# Patient Record
Sex: Female | Born: 1990 | Race: Black or African American | Hispanic: No | Marital: Single | Smoking: Current every day smoker
Health system: Southern US, Community
[De-identification: ages and names within clinical notes are randomized; demographics above are authoritative.]

## PROBLEM LIST (undated history)

## (undated) DIAGNOSIS — IMO0002 Reserved for concepts with insufficient information to code with codable children: Secondary | ICD-10-CM

## (undated) DIAGNOSIS — D573 Sickle-cell trait: Secondary | ICD-10-CM

## (undated) DIAGNOSIS — N760 Acute vaginitis: Secondary | ICD-10-CM

## (undated) DIAGNOSIS — B373 Candidiasis of vulva and vagina: Secondary | ICD-10-CM

## (undated) DIAGNOSIS — IMO0001 Reserved for inherently not codable concepts without codable children: Secondary | ICD-10-CM

## (undated) DIAGNOSIS — R896 Abnormal cytological findings in specimens from other organs, systems and tissues: Secondary | ICD-10-CM

## (undated) DIAGNOSIS — N870 Mild cervical dysplasia: Secondary | ICD-10-CM

## (undated) DIAGNOSIS — Z8742 Personal history of other diseases of the female genital tract: Secondary | ICD-10-CM

## (undated) DIAGNOSIS — L309 Dermatitis, unspecified: Secondary | ICD-10-CM

## (undated) HISTORY — DX: Abnormal cytological findings in specimens from other organs, systems and tissues: R89.6

## (undated) HISTORY — DX: Acute vaginitis: N76.0

## (undated) HISTORY — DX: Personal history of other diseases of the female genital tract: Z87.42

## (undated) HISTORY — DX: Candidiasis of vulva and vagina: B37.3

## (undated) HISTORY — DX: Reserved for concepts with insufficient information to code with codable children: IMO0002

## (undated) HISTORY — DX: Sickle-cell trait: D57.3

## (undated) HISTORY — DX: Reserved for inherently not codable concepts without codable children: IMO0001

## (undated) HISTORY — PX: WISDOM TOOTH EXTRACTION: SHX21

## (undated) HISTORY — DX: Mild cervical dysplasia: N87.0

---
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
## 2008-03-16 ENCOUNTER — Emergency Department (HOSPITAL_COMMUNITY): Admission: EM | Admit: 2008-03-16 | Discharge: 2008-03-16 | Payer: Self-pay | Admitting: Emergency Medicine

## 2009-01-05 DIAGNOSIS — IMO0002 Reserved for concepts with insufficient information to code with codable children: Secondary | ICD-10-CM

## 2009-01-05 DIAGNOSIS — R87619 Unspecified abnormal cytological findings in specimens from cervix uteri: Secondary | ICD-10-CM

## 2009-01-05 HISTORY — DX: Unspecified abnormal cytological findings in specimens from cervix uteri: R87.619

## 2009-01-05 HISTORY — DX: Reserved for concepts with insufficient information to code with codable children: IMO0002

## 2010-04-17 LAB — POCT I-STAT, CHEM 8
BUN: 8 mg/dL (ref 6–23)
Calcium, Ion: 1.17 mmol/L (ref 1.12–1.32)
Chloride: 105 mEq/L (ref 96–112)
Creatinine, Ser: 0.9 mg/dL (ref 0.4–1.2)
Glucose, Bld: 79 mg/dL (ref 70–99)
HCT: 41 % (ref 36.0–46.0)
Hemoglobin: 13.9 g/dL (ref 12.0–15.0)
Potassium: 4.2 mEq/L (ref 3.5–5.1)
Sodium: 137 mEq/L (ref 135–145)
TCO2: 24 mmol/L (ref 0–100)

## 2010-04-17 LAB — POCT URINALYSIS DIP (DEVICE)
Bilirubin Urine: NEGATIVE
Glucose, UA: NEGATIVE mg/dL
Hgb urine dipstick: NEGATIVE
Ketones, ur: NEGATIVE mg/dL
Nitrite: NEGATIVE
Protein, ur: 30 mg/dL — AB
Specific Gravity, Urine: 1.02 (ref 1.005–1.030)
Urobilinogen, UA: 1 mg/dL (ref 0.0–1.0)
pH: 8.5 — ABNORMAL HIGH (ref 5.0–8.0)

## 2010-04-17 LAB — POCT PREGNANCY, URINE: Preg Test, Ur: NEGATIVE

## 2010-12-04 ENCOUNTER — Emergency Department (HOSPITAL_COMMUNITY)
Admission: EM | Admit: 2010-12-04 | Discharge: 2010-12-04 | Disposition: A | Discharge status: Home / Self Care | Payer: No Typology Code available for payment source | Attending: Emergency Medicine | Admitting: Emergency Medicine

## 2010-12-04 ENCOUNTER — Encounter: Payer: Self-pay | Admitting: *Deleted

## 2010-12-04 DIAGNOSIS — Y92410 Unspecified street and highway as the place of occurrence of the external cause: Secondary | ICD-10-CM | POA: Insufficient documentation

## 2010-12-04 DIAGNOSIS — S01501A Unspecified open wound of lip, initial encounter: Secondary | ICD-10-CM | POA: Insufficient documentation

## 2010-12-04 DIAGNOSIS — S01511A Laceration without foreign body of lip, initial encounter: Secondary | ICD-10-CM

## 2010-12-04 DIAGNOSIS — M79609 Pain in unspecified limb: Secondary | ICD-10-CM | POA: Insufficient documentation

## 2010-12-04 DIAGNOSIS — IMO0002 Reserved for concepts with insufficient information to code with codable children: Secondary | ICD-10-CM | POA: Insufficient documentation

## 2010-12-04 DIAGNOSIS — M79603 Pain in arm, unspecified: Secondary | ICD-10-CM

## 2010-12-04 DIAGNOSIS — M25519 Pain in unspecified shoulder: Secondary | ICD-10-CM | POA: Insufficient documentation

## 2010-12-04 MED ORDER — IBUPROFEN 800 MG PO TABS: 800.0000 mg | ORAL_TABLET | Freq: Three times a day (TID) | ORAL | Status: AC

## 2010-12-04 MED ORDER — DIAZEPAM 5 MG PO TABS: 5.0000 mg | ORAL_TABLET | Freq: Three times a day (TID) | ORAL | Status: AC | PRN
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
## 2010-12-05 NOTE — ED Provider Notes (Signed)
Medical screening examination/treatment/procedure(s) were performed by non-physician practitioner and as supervising physician I was immediately available for consultation/collaboration.   Rolan Bucco, MD 12/05/10 [phone]

## 2011-01-06 NOTE — L&D Delivery Note (Signed)
Delivery Note  Pt began pushing about before delivery and FHR variables to 90's, pt pushed well, and   At 5:38 PM a viable female was delivered via Vaginal, Spontaneous Delivery (Presentation: OP).  Loose nuchal cord infant delivered through, APGAR: 9, 9 ; weight: pending  Placenta status: Intact, Spontaneous.  Cord: 3 vessels with the following complications: None.  Cord pH: n/a  Anesthesia: Epidural  Episiotomy: None Lacerations: 2nd degree;Perineal Suture Repair: 3.0 vicryl rapide Est. Blood Loss (mL): 300  Mom to postpartum.  Baby to nursery-stable. Skin-skin Pt plans to breast feed Dr Normand Sloop notified   Malissa Hippo 09/10/2011, 6:41 PM

## 2011-01-12 ENCOUNTER — Emergency Department (HOSPITAL_COMMUNITY)
Admission: EM | Admit: 2011-01-12 | Discharge: 2011-01-13 | Disposition: A | Discharge status: Home / Self Care | Payer: 59 | Attending: Emergency Medicine | Admitting: Emergency Medicine

## 2011-01-12 ENCOUNTER — Other Ambulatory Visit: Payer: Self-pay

## 2011-01-12 ENCOUNTER — Encounter (HOSPITAL_COMMUNITY): Payer: Self-pay | Admitting: *Deleted

## 2011-01-12 DIAGNOSIS — R55 Syncope and collapse: Secondary | ICD-10-CM | POA: Insufficient documentation

## 2011-01-12 DIAGNOSIS — Z3201 Encounter for pregnancy test, result positive: Secondary | ICD-10-CM | POA: Insufficient documentation

## 2011-01-12 DIAGNOSIS — I499 Cardiac arrhythmia, unspecified: Secondary | ICD-10-CM | POA: Insufficient documentation

## 2011-01-12 LAB — PREGNANCY, URINE: Preg Test, Ur: POSITIVE

## 2011-01-12 MED ORDER — SODIUM CHLORIDE 0.9 % IV SOLN: Freq: Once | INTRAVENOUS | Status: DC

## 2011-01-12 NOTE — ED Provider Notes (Signed)
History     CSN: [id number]  Arrival date & time 01/12/11  2211   First MD Initiated Contact with Patient 01/12/11 2319      Chief Complaint  Patient presents with  . Loss of Consciousness    (Consider location/radiation/quality/duration/timing/severity/associated sxs/prior treatment) HPI Comments: Patient states she has had little to eat or drink, today.  She's been babysitting an infant.  She was leaning over in the refrigerator getting water in she became dizzy.  She took down and tried to walk back to her room to lay down and she became dizzy, weak.  She leaned against the wall and slid to the floor, landing on her buttock.  She was awake and aware, the whole time.  She states that her ears acted like they were plugged and she was listening to things from far away, this lasted about 1 minute  Patient is a 21 y.o. female presenting with syncope. The history is provided by the patient.  Loss of Consciousness This is a new problem. The current episode started today. The problem has been resolved. Pertinent negatives include no abdominal pain, change in bowel habit, chills, congestion, coughing, fever, nausea, neck pain, numbness, rash, sore throat, urinary symptoms, vertigo, vomiting or weakness. The symptoms are aggravated by nothing.    History reviewed. No pertinent past medical history.  History reviewed. No pertinent past surgical history.  History reviewed. No pertinent family history.  History  Substance Use Topics  . Smoking status: Never Smoker   . Smokeless tobacco: Not on file  . Alcohol Use: No    OB History    Grav Para Term Preterm Abortions TAB SAB Ect Mult Living                  Review of Systems  Constitutional: Negative for fever, chills, activity change and appetite change.  HENT: Negative for congestion, sore throat, rhinorrhea and neck pain.   Eyes: Negative for visual disturbance.  Respiratory: Negative for cough, shortness of breath and wheezing.     Cardiovascular: Positive for syncope.  Gastrointestinal: Negative for nausea, vomiting, abdominal pain, diarrhea and change in bowel habit.  Genitourinary: Negative for dysuria and vaginal bleeding.  Musculoskeletal: Negative.   Skin: Negative for rash.  Neurological: Positive for syncope. Negative for dizziness, vertigo, seizures, speech difficulty, weakness and numbness.  Hematological: Negative.   Psychiatric/Behavioral: Negative.     Allergies  Shellfish allergy  Home Medications   Current Outpatient Rx  Name Route Sig Dispense Refill  . PRENATAL COMPLETE 14-0.4 MG PO TABS Oral Take 1 tablet by mouth 1 day or 1 dose. 60 each 0    BP 121/57  Pulse 105  Temp(Src) 98 F (36.7 C) (Oral)  Resp 16  Wt 150 lb (68.04 kg)  SpO2 100%  LMP 12/08/2010  Physical Exam  Constitutional: She is oriented to person, place, and time. She appears well-developed.  HENT:  Head: Normocephalic.  Eyes: Pupils are equal, round, and reactive to light.  Neck: Normal range of motion.  Cardiovascular: Normal rate.  An irregularly irregular rhythm present.  Pulmonary/Chest: Breath sounds normal.  Abdominal: Soft. Bowel sounds are normal.  Musculoskeletal: Normal range of motion.  Neurological: She is alert and oriented to person, place, and time.  Skin: Skin is warm and dry.  Psychiatric: She has a normal mood and affect.    ED Course  Procedures (including critical care time)  Labs Reviewed  CBC - Abnormal; Notable for the following:    WBC  16.7 (*)    All other components within normal limits  DIFFERENTIAL - Abnormal; Notable for the following:    Neutrophils Relative 90 (*)    Neutro Abs 14.9 (*)    Lymphocytes Relative 6 (*)    All other components within normal limits  PREGNANCY, URINE  I-STAT, CHEM 8  CBC  DIFFERENTIAL   No results found.   1. Near syncope   2. Pregnancy test positive     ED ECG REPORT   Date: 01/13/2011  EKG Time: 1:53 AM  Rate: 93  Rhythm: sinus  arrhythmia,  normal EKG, normal sinus rhythm, unchanged from previous tracings, there are no previous tracings available for comparison  Axis: normal  Intervals:none  ST&T Change: none   Narrative Interpretation:short PR interval      Positive pregnancy test       MDM  Near-syncopal episode, will be evaluated with urine.  Urine pregnancy test evaluate EKG will hydrate with IV fluid        Arman Filter, NP 01/12/11 2331  Arman Filter, NP 01/13/11 0149  Arman Filter, NP 01/13/11 0454
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
## 2011-01-12 NOTE — ED Notes (Addendum)
Per EMS- pt in s/p syncopal episode, states she was feeling dizzy and got up to go get some water and passed out, was confused for a few min after waking up, VS stable for EMS- last period was 12/08/10

## 2011-01-13 LAB — DIFFERENTIAL
Basophils Absolute: 0 10*3/uL (ref 0.0–0.1)
Basophils Relative: 0 % (ref 0–1)
Eosinophils Absolute: 0 10*3/uL (ref 0.0–0.7)
Eosinophils Relative: 0 % (ref 0–5)
Lymphocytes Relative: 6 % — ABNORMAL LOW (ref 12–46)
Lymphs Abs: 1.1 10*3/uL (ref 0.7–4.0)
Monocytes Absolute: 0.7 10*3/uL (ref 0.1–1.0)
Monocytes Relative: 4 % (ref 3–12)
Neutro Abs: 14.9 10*3/uL — ABNORMAL HIGH (ref 1.7–7.7)
Neutrophils Relative %: 90 % — ABNORMAL HIGH (ref 43–77)

## 2011-01-13 LAB — CBC
HCT: 36.8 % (ref 36.0–46.0)
Hemoglobin: 12.5 g/dL (ref 12.0–15.0)
MCH: 26.8 pg (ref 26.0–34.0)
MCHC: 34 g/dL (ref 30.0–36.0)
MCV: 79 fL (ref 78.0–100.0)
Platelets: 265 10*3/uL (ref 150–400)
RBC: 4.66 MIL/uL (ref 3.87–5.11)
RDW: 13.9 % (ref 11.5–15.5)
WBC: 16.7 10*3/uL — ABNORMAL HIGH (ref 4.0–10.5)

## 2011-01-13 MED ORDER — PRENATAL COMPLETE 14-0.4 MG PO TABS: 1.0000 | ORAL_TABLET | ORAL | Status: DC

## 2011-01-13 NOTE — ED Notes (Signed)
Rx given to pt. 

## 2011-01-13 NOTE — ED Provider Notes (Signed)
Medical screening examination/treatment/procedure(s) were performed by non-physician practitioner and as supervising physician I was immediately available for consultation/collaboration.  Raeford Razor, MD 01/13/11 [phone]

## 2011-01-14 LAB — POCT I-STAT, CHEM 8
BUN: 11 mg/dL (ref 6–23)
Calcium, Ion: 1.16 mmol/L (ref 1.12–1.32)
Chloride: 110 mEq/L (ref 96–112)
Creatinine, Ser: 0.9 mg/dL (ref 0.50–1.10)
Glucose, Bld: 95 mg/dL (ref 70–99)
HCT: 45 % (ref 36.0–46.0)
Hemoglobin: 15.3 g/dL — ABNORMAL HIGH (ref 12.0–15.0)
Potassium: 5.3 mEq/L — ABNORMAL HIGH (ref 3.5–5.1)
Sodium: 137 mEq/L (ref 135–145)
TCO2: 17 mmol/L (ref 0–100)

## 2011-03-19 ENCOUNTER — Encounter (INDEPENDENT_AMBULATORY_CARE_PROVIDER_SITE_OTHER): Payer: 59

## 2011-03-19 DIAGNOSIS — Z331 Pregnant state, incidental: Secondary | ICD-10-CM

## 2011-04-07 ENCOUNTER — Other Ambulatory Visit: Payer: Self-pay

## 2011-04-07 DIAGNOSIS — Z3689 Encounter for other specified antenatal screening: Secondary | ICD-10-CM

## 2011-04-10 DIAGNOSIS — Z91013 Allergy to seafood: Secondary | ICD-10-CM

## 2011-04-23 ENCOUNTER — Ambulatory Visit (INDEPENDENT_AMBULATORY_CARE_PROVIDER_SITE_OTHER): Payer: Medicaid Other | Admitting: Obstetrics and Gynecology

## 2011-04-23 ENCOUNTER — Encounter: Payer: Self-pay | Admitting: Obstetrics and Gynecology

## 2011-04-23 ENCOUNTER — Ambulatory Visit (INDEPENDENT_AMBULATORY_CARE_PROVIDER_SITE_OTHER): Payer: 59

## 2011-04-23 VITALS — BP 122/60 | Ht 62.0 in | Wt 149.0 lb

## 2011-04-23 DIAGNOSIS — Z3689 Encounter for other specified antenatal screening: Secondary | ICD-10-CM

## 2011-04-23 DIAGNOSIS — Z331 Pregnant state, incidental: Secondary | ICD-10-CM

## 2011-04-23 LAB — US OB COMP + 14 WK

## 2011-04-23 NOTE — Progress Notes (Signed)
Pt teary today worried about how to take care of baby, pt's mother here today and supportive Anat [place] today S=D  Cx=3.78cm Posterior placenta grade 0 Normal fluid No anomalies  rv'd diet recommendations, increase protein  Will watch wgt gain 9lbs thus far Pt desires quad screen, will draw today

## 2011-04-27 LAB — AFP, QUAD SCREEN
AFP: 32.9 IU/mL
Age Alone: 1:1130 {titer}
HCG, Total: 26046 m[IU]/mL
Interpretation-AFP: NEGATIVE
MoM for AFP: 0.66
Open Spina bifida: NEGATIVE

## 2011-05-21 ENCOUNTER — Ambulatory Visit (INDEPENDENT_AMBULATORY_CARE_PROVIDER_SITE_OTHER): Payer: Medicaid Other | Admitting: Obstetrics and Gynecology

## 2011-05-21 ENCOUNTER — Encounter: Payer: Self-pay | Admitting: Obstetrics and Gynecology

## 2011-05-21 VITALS — BP 100/58 | Wt 155.0 lb

## 2011-05-21 DIAGNOSIS — Z3490 Encounter for supervision of normal pregnancy, unspecified, unspecified trimester: Secondary | ICD-10-CM

## 2011-05-21 DIAGNOSIS — Z331 Pregnant state, incidental: Secondary | ICD-10-CM

## 2011-05-21 NOTE — Progress Notes (Signed)
No concerns today 

## 2011-05-21 NOTE — Progress Notes (Signed)
Doing well--mild cold. OTC remedies reviewed. Glucola, Hgb, RPR NV Feeling more confident now, less anxious.

## 2011-06-18 ENCOUNTER — Encounter: Payer: Self-pay | Admitting: Obstetrics and Gynecology

## 2011-06-18 ENCOUNTER — Other Ambulatory Visit: Payer: Medicaid Other

## 2011-06-18 ENCOUNTER — Ambulatory Visit (INDEPENDENT_AMBULATORY_CARE_PROVIDER_SITE_OTHER): Payer: Medicaid Other | Admitting: Obstetrics and Gynecology

## 2011-06-18 VITALS — BP 100/62 | Wt 163.0 lb

## 2011-06-18 DIAGNOSIS — Z3490 Encounter for supervision of normal pregnancy, unspecified, unspecified trimester: Secondary | ICD-10-CM

## 2011-06-18 DIAGNOSIS — F17200 Nicotine dependence, unspecified, uncomplicated: Secondary | ICD-10-CM

## 2011-06-18 DIAGNOSIS — Z331 Pregnant state, incidental: Secondary | ICD-10-CM

## 2011-06-18 LAB — HEMOGLOBIN AND HEMATOCRIT, BLOOD: HCT: 31.3 % — ABNORMAL LOW (ref 36.0–46.0)

## 2011-06-18 NOTE — Progress Notes (Signed)
1 gtt given today without difficulty  

## 2011-06-18 NOTE — Progress Notes (Signed)
Doing well.  Reviewed weight gain, with tips for limiting/monitoring carb intake discussed. Glucola, Hgb, and RPR today. Needs note for work allowing snacks, fluids, and frequent bathroom breaks--done.

## 2011-06-19 LAB — GLUCOSE TOLERANCE, 1 HOUR: Glucose, 1 Hour GTT: 113 mg/dL (ref 70–140)

## 2011-07-02 ENCOUNTER — Ambulatory Visit (INDEPENDENT_AMBULATORY_CARE_PROVIDER_SITE_OTHER): Payer: Medicaid Other | Admitting: Obstetrics and Gynecology

## 2011-07-02 VITALS — BP 110/64 | Wt 165.0 lb

## 2011-07-02 DIAGNOSIS — Z331 Pregnant state, incidental: Secondary | ICD-10-CM

## 2011-07-02 NOTE — Progress Notes (Signed)
C/O : Lower Back Pain No other concerns.

## 2011-07-02 NOTE — Progress Notes (Signed)
During well. Hemoglobin 10.2.  Glucola 113.  RPR nonreactive. Return to office in 2 weeks. Needs forms field out for work. Dr. Stefano Gaul

## 2011-07-16 ENCOUNTER — Ambulatory Visit (INDEPENDENT_AMBULATORY_CARE_PROVIDER_SITE_OTHER): Payer: Medicaid Other | Admitting: Obstetrics and Gynecology

## 2011-07-16 ENCOUNTER — Encounter: Payer: Self-pay | Admitting: Obstetrics and Gynecology

## 2011-07-16 VITALS — BP 102/60 | Wt 169.0 lb

## 2011-07-16 DIAGNOSIS — Z3400 Encounter for supervision of normal first pregnancy, unspecified trimester: Secondary | ICD-10-CM

## 2011-07-16 NOTE — Progress Notes (Signed)
No complaints. Doing well Checklist reviewed

## 2011-07-30 ENCOUNTER — Ambulatory Visit (INDEPENDENT_AMBULATORY_CARE_PROVIDER_SITE_OTHER): Payer: Medicaid Other | Admitting: Obstetrics and Gynecology

## 2011-07-30 ENCOUNTER — Encounter: Payer: Self-pay | Admitting: Obstetrics and Gynecology

## 2011-07-30 VITALS — BP 102/58 | Wt 171.0 lb

## 2011-07-30 DIAGNOSIS — Z331 Pregnant state, incidental: Secondary | ICD-10-CM

## 2011-07-30 DIAGNOSIS — R12 Heartburn: Secondary | ICD-10-CM

## 2011-07-30 MED ORDER — PANTOPRAZOLE SODIUM 20 MG PO TBEC: 20.0000 mg | DELAYED_RELEASE_TABLET | Freq: Every day | ORAL | Status: DC

## 2011-07-30 NOTE — Progress Notes (Signed)
Pt c/o a lot of heart burn late at night.

## 2011-07-30 NOTE — Progress Notes (Addendum)
Return to office in 2 weeks. Patient is sad today but did not say why. Protonix for heartburn. Dr. Stefano Gaul

## 2011-07-30 NOTE — Addendum Note (Signed)
Addended by: Janine Limbo on: 07/30/2011 02:02 PM   Modules accepted: Orders

## 2011-08-13 ENCOUNTER — Encounter: Payer: Self-pay | Admitting: Obstetrics and Gynecology

## 2011-08-13 ENCOUNTER — Ambulatory Visit (INDEPENDENT_AMBULATORY_CARE_PROVIDER_SITE_OTHER): Payer: Medicaid Other | Admitting: Obstetrics and Gynecology

## 2011-08-13 VITALS — BP 90/66 | Wt 174.0 lb

## 2011-08-13 DIAGNOSIS — Z331 Pregnant state, incidental: Secondary | ICD-10-CM

## 2011-08-13 NOTE — Progress Notes (Signed)
Doing well--in good spirits today, but admits to anxiety about labor. Questions reviewed. GBS, GC, chlamydia today. Cervix closed, vtx.

## 2011-08-13 NOTE — Progress Notes (Signed)
C/o increased pelvic pressure.

## 2011-08-14 LAB — GC/CHLAMYDIA PROBE AMP, GENITAL: Chlamydia, DNA Probe: NEGATIVE

## 2011-08-15 ENCOUNTER — Encounter: Payer: Self-pay | Admitting: Obstetrics and Gynecology

## 2011-08-15 DIAGNOSIS — O99820 Streptococcus B carrier state complicating pregnancy: Secondary | ICD-10-CM | POA: Insufficient documentation

## 2011-08-20 ENCOUNTER — Encounter: Payer: Self-pay | Admitting: Obstetrics and Gynecology

## 2011-08-20 ENCOUNTER — Ambulatory Visit (INDEPENDENT_AMBULATORY_CARE_PROVIDER_SITE_OTHER): Payer: Medicaid Other | Admitting: Obstetrics and Gynecology

## 2011-08-20 VITALS — BP 102/60 | Wt 174.0 lb

## 2011-08-20 DIAGNOSIS — Z331 Pregnant state, incidental: Secondary | ICD-10-CM

## 2011-08-20 NOTE — Progress Notes (Signed)
No concerns, 

## 2011-08-20 NOTE — Progress Notes (Signed)
[redacted]w[redacted]d Doing well. GC negative.  Chlamydia negative.  Beta strep positive.  Treatment in labor discussed. Return office in 1 week. Dr. Stefano Gaul

## 2011-08-27 ENCOUNTER — Encounter: Payer: Self-pay | Admitting: Obstetrics and Gynecology

## 2011-08-27 ENCOUNTER — Ambulatory Visit (INDEPENDENT_AMBULATORY_CARE_PROVIDER_SITE_OTHER): Payer: Medicaid Other | Admitting: Obstetrics and Gynecology

## 2011-08-27 VITALS — BP 118/64 | Wt 174.0 lb

## 2011-08-27 DIAGNOSIS — Z331 Pregnant state, incidental: Secondary | ICD-10-CM

## 2011-08-27 NOTE — Progress Notes (Signed)
[redacted]w[redacted]d Doing well. Maternity leave to start September 1. Return office in 1 week. Dr. Stefano Gaul

## 2011-08-27 NOTE — Progress Notes (Signed)
Pt states no concerns today.   

## 2011-09-03 ENCOUNTER — Encounter: Payer: Self-pay | Admitting: Obstetrics and Gynecology

## 2011-09-03 ENCOUNTER — Ambulatory Visit (INDEPENDENT_AMBULATORY_CARE_PROVIDER_SITE_OTHER): Payer: Medicaid Other | Admitting: Obstetrics and Gynecology

## 2011-09-03 VITALS — BP 100/70 | Wt 174.0 lb

## 2011-09-03 DIAGNOSIS — Z3490 Encounter for supervision of normal pregnancy, unspecified, unspecified trimester: Secondary | ICD-10-CM

## 2011-09-03 DIAGNOSIS — Z331 Pregnant state, incidental: Secondary | ICD-10-CM

## 2011-09-03 NOTE — Progress Notes (Signed)
Wants cx checked

## 2011-09-03 NOTE — Progress Notes (Signed)
Doing well--ready, but patient. Note for work--starting maternity leave on Sept 1 per AVS.  Needs note regarding RTW.

## 2011-09-10 ENCOUNTER — Inpatient Hospital Stay (HOSPITAL_COMMUNITY)
Admission: AD | Admit: 2011-09-10 | Discharge: 2011-09-12 | DRG: 775 | Disposition: A | Discharge status: Home / Self Care | Payer: Medicaid Other | Source: Ambulatory Visit | Attending: Obstetrics and Gynecology | Admitting: Obstetrics and Gynecology

## 2011-09-10 ENCOUNTER — Encounter (HOSPITAL_COMMUNITY): Payer: Self-pay | Admitting: Anesthesiology

## 2011-09-10 ENCOUNTER — Inpatient Hospital Stay (HOSPITAL_COMMUNITY): Payer: Medicaid Other | Admitting: Anesthesiology

## 2011-09-10 ENCOUNTER — Encounter (HOSPITAL_COMMUNITY): Payer: Self-pay | Admitting: *Deleted

## 2011-09-10 ENCOUNTER — Encounter (HOSPITAL_COMMUNITY): Payer: Self-pay | Admitting: Obstetrics and Gynecology

## 2011-09-10 DIAGNOSIS — Z91013 Allergy to seafood: Secondary | ICD-10-CM | POA: Diagnosis present

## 2011-09-10 DIAGNOSIS — O99892 Other specified diseases and conditions complicating childbirth: Secondary | ICD-10-CM | POA: Diagnosis present

## 2011-09-10 DIAGNOSIS — Z22330 Carrier of Group B streptococcus: Secondary | ICD-10-CM

## 2011-09-10 DIAGNOSIS — F17200 Nicotine dependence, unspecified, uncomplicated: Secondary | ICD-10-CM | POA: Diagnosis present

## 2011-09-10 DIAGNOSIS — O99820 Streptococcus B carrier state complicating pregnancy: Secondary | ICD-10-CM

## 2011-09-10 DIAGNOSIS — I499 Cardiac arrhythmia, unspecified: Secondary | ICD-10-CM | POA: Diagnosis present

## 2011-09-10 LAB — OB RESULTS CONSOLE RPR: RPR: NONREACTIVE

## 2011-09-10 LAB — CBC
HCT: 39.5 % (ref 36.0–46.0)
MCHC: 33.2 g/dL (ref 30.0–36.0)
Platelets: 212 10*3/uL (ref 150–400)
RDW: 14.4 % (ref 11.5–15.5)
WBC: 14.9 10*3/uL — ABNORMAL HIGH (ref 4.0–10.5)

## 2011-09-10 LAB — OB RESULTS CONSOLE ABO/RH: RH Type: POSITIVE

## 2011-09-10 LAB — OB RESULTS CONSOLE HIV ANTIBODY (ROUTINE TESTING): HIV: NONREACTIVE

## 2011-09-10 MED ORDER — FENTANYL CITRATE 0.05 MG/ML IJ SOLN
100.0000 ug | INTRAMUSCULAR | Status: DC | PRN
  Administered 2011-09-10: 100 ug via INTRAVENOUS

## 2011-09-10 MED ORDER — SENNOSIDES-DOCUSATE SODIUM 8.6-50 MG PO TABS
2.0000 | ORAL_TABLET | Freq: Every day | ORAL | Status: DC
  Administered 2011-09-10 – 2011-09-11 (×2): 2 via ORAL

## 2011-09-10 MED ORDER — CITRIC ACID-SODIUM CITRATE 334-500 MG/5ML PO SOLN: 30.0000 mL | ORAL | Status: DC | PRN

## 2011-09-10 MED ORDER — BENZOCAINE-MENTHOL 20-0.5 % EX AERO
1.0000 "application " | INHALATION_SPRAY | CUTANEOUS | Status: DC | PRN
  Administered 2011-09-11 – 2011-09-12 (×2): 1 via TOPICAL
  Filled 2011-09-10 (×2): qty 56

## 2011-09-10 MED ORDER — FENTANYL 2.5 MCG/ML BUPIVACAINE 1/10 % EPIDURAL INFUSION (WH - ANES)
INTRAMUSCULAR | Status: DC | PRN
  Administered 2011-09-10: 14 mL/h via EPIDURAL

## 2011-09-10 MED ORDER — FENTANYL 2.5 MCG/ML BUPIVACAINE 1/10 % EPIDURAL INFUSION (WH - ANES)
14.0000 mL/h | INTRAMUSCULAR | Status: DC
  Administered 2011-09-10: 14 mL/h via EPIDURAL
  Filled 2011-09-10 (×2): qty 60

## 2011-09-10 MED ORDER — PENICILLIN G POTASSIUM 5000000 UNITS IJ SOLR
2.5000 10*6.[IU] | INTRAVENOUS | Status: DC
  Administered 2011-09-10: 2.5 10*6.[IU] via INTRAVENOUS
  Filled 2011-09-10 (×4): qty 2.5

## 2011-09-10 MED ORDER — LANOLIN HYDROUS EX OINT: TOPICAL_OINTMENT | CUTANEOUS | Status: DC | PRN

## 2011-09-10 MED ORDER — PHENYLEPHRINE 40 MCG/ML (10ML) SYRINGE FOR IV PUSH (FOR BLOOD PRESSURE SUPPORT)
80.0000 ug | PREFILLED_SYRINGE | INTRAVENOUS | Status: DC | PRN
  Filled 2011-09-10: qty 5

## 2011-09-10 MED ORDER — ACETAMINOPHEN 325 MG PO TABS: 650.0000 mg | ORAL_TABLET | ORAL | Status: DC | PRN

## 2011-09-10 MED ORDER — PENICILLIN G POTASSIUM 5000000 UNITS IJ SOLR
5.0000 10*6.[IU] | Freq: Once | INTRAVENOUS | Status: AC
  Administered 2011-09-10: 5 10*6.[IU] via INTRAVENOUS
  Filled 2011-09-10: qty 5

## 2011-09-10 MED ORDER — ONDANSETRON HCL 4 MG/2ML IJ SOLN: 4.0000 mg | INTRAMUSCULAR | Status: DC | PRN

## 2011-09-10 MED ORDER — OXYTOCIN BOLUS FROM INFUSION
250.0000 mL | Freq: Once | INTRAVENOUS | Status: AC
  Administered 2011-09-10: 250 mL via INTRAVENOUS
  Filled 2011-09-10: qty 500

## 2011-09-10 MED ORDER — OXYCODONE-ACETAMINOPHEN 5-325 MG PO TABS: 1.0000 | ORAL_TABLET | ORAL | Status: DC | PRN

## 2011-09-10 MED ORDER — EPHEDRINE 5 MG/ML INJ: 10.0000 mg | INTRAVENOUS | Status: DC | PRN

## 2011-09-10 MED ORDER — LIDOCAINE HCL (PF) 1 % IJ SOLN
30.0000 mL | INTRAMUSCULAR | Status: DC | PRN
  Administered 2011-09-10: 30 mL via SUBCUTANEOUS
  Filled 2011-09-10: qty 30

## 2011-09-10 MED ORDER — IBUPROFEN 600 MG PO TABS: 600.0000 mg | ORAL_TABLET | Freq: Four times a day (QID) | ORAL | Status: DC | PRN

## 2011-09-10 MED ORDER — SIMETHICONE 80 MG PO CHEW: 80.0000 mg | CHEWABLE_TABLET | ORAL | Status: DC | PRN

## 2011-09-10 MED ORDER — FLEET ENEMA 7-19 GM/118ML RE ENEM: 1.0000 | ENEMA | RECTAL | Status: DC | PRN

## 2011-09-10 MED ORDER — DIPHENHYDRAMINE HCL 25 MG PO CAPS: 25.0000 mg | ORAL_CAPSULE | Freq: Four times a day (QID) | ORAL | Status: DC | PRN

## 2011-09-10 MED ORDER — EPHEDRINE 5 MG/ML INJ
10.0000 mg | INTRAVENOUS | Status: DC | PRN
  Filled 2011-09-10: qty 4

## 2011-09-10 MED ORDER — ONDANSETRON HCL 4 MG/2ML IJ SOLN: 4.0000 mg | Freq: Four times a day (QID) | INTRAMUSCULAR | Status: DC | PRN

## 2011-09-10 MED ORDER — FENTANYL CITRATE 0.05 MG/ML IJ SOLN
INTRAMUSCULAR | Status: AC
  Administered 2011-09-10: 100 ug via INTRAVENOUS
  Filled 2011-09-10: qty 2

## 2011-09-10 MED ORDER — LACTATED RINGERS IV SOLN
INTRAVENOUS | Status: DC
  Administered 2011-09-10: 11:00:00 via INTRAVENOUS

## 2011-09-10 MED ORDER — ZOLPIDEM TARTRATE 5 MG PO TABS: 5.0000 mg | ORAL_TABLET | Freq: Every evening | ORAL | Status: DC | PRN

## 2011-09-10 MED ORDER — OXYCODONE-ACETAMINOPHEN 5-325 MG PO TABS
1.0000 | ORAL_TABLET | ORAL | Status: DC | PRN
  Filled 2011-09-10: qty 2

## 2011-09-10 MED ORDER — ONDANSETRON HCL 4 MG PO TABS: 4.0000 mg | ORAL_TABLET | ORAL | Status: DC | PRN

## 2011-09-10 MED ORDER — MEASLES, MUMPS & RUBELLA VAC ~~LOC~~ INJ: 0.5000 mL | INJECTION | Freq: Once | SUBCUTANEOUS | Status: DC

## 2011-09-10 MED ORDER — DIBUCAINE 1 % RE OINT: 1.0000 "application " | TOPICAL_OINTMENT | RECTAL | Status: DC | PRN

## 2011-09-10 MED ORDER — PHENYLEPHRINE 40 MCG/ML (10ML) SYRINGE FOR IV PUSH (FOR BLOOD PRESSURE SUPPORT): 80.0000 ug | PREFILLED_SYRINGE | INTRAVENOUS | Status: DC | PRN

## 2011-09-10 MED ORDER — OXYTOCIN 40 UNITS IN LACTATED RINGERS INFUSION - SIMPLE MED
62.5000 mL/h | Freq: Once | INTRAVENOUS | Status: AC
  Administered 2011-09-10: 62.5 mL/h via INTRAVENOUS
  Filled 2011-09-10: qty 1000

## 2011-09-10 MED ORDER — DIPHENHYDRAMINE HCL 50 MG/ML IJ SOLN: 12.5000 mg | INTRAMUSCULAR | Status: DC | PRN

## 2011-09-10 MED ORDER — LACTATED RINGERS IV SOLN
500.0000 mL | INTRAVENOUS | Status: DC | PRN
  Administered 2011-09-10: 500 mL via INTRAVENOUS

## 2011-09-10 MED ORDER — WITCH HAZEL-GLYCERIN EX PADS: 1.0000 "application " | MEDICATED_PAD | CUTANEOUS | Status: DC | PRN

## 2011-09-10 MED ORDER — PRENATAL MULTIVITAMIN CH
1.0000 | ORAL_TABLET | Freq: Every day | ORAL | Status: DC
  Administered 2011-09-10 – 2011-09-12 (×3): 1 via ORAL
  Filled 2011-09-10 (×3): qty 1

## 2011-09-10 MED ORDER — LACTATED RINGERS IV SOLN: 500.0000 mL | Freq: Once | INTRAVENOUS | Status: DC

## 2011-09-10 MED ORDER — TETANUS-DIPHTH-ACELL PERTUSSIS 5-2.5-18.5 LF-MCG/0.5 IM SUSP: 0.5000 mL | Freq: Once | INTRAMUSCULAR | Status: DC

## 2011-09-10 MED ORDER — LIDOCAINE HCL (PF) 1 % IJ SOLN
INTRAMUSCULAR | Status: DC | PRN
  Administered 2011-09-10 (×2): 9 mL

## 2011-09-10 MED ORDER — IBUPROFEN 600 MG PO TABS
600.0000 mg | ORAL_TABLET | Freq: Four times a day (QID) | ORAL | Status: DC
  Administered 2011-09-10 – 2011-09-12 (×6): 600 mg via ORAL
  Filled 2011-09-10 (×7): qty 1

## 2011-09-10 NOTE — Anesthesia Procedure Notes (Signed)
Epidural Patient location during procedure: OB Start time: 09/10/2011 12:35 PM End time: 09/10/2011 12:39 PM  Staffing Anesthesiologist: Sandrea Hughs Performed by: anesthesiologist   Preanesthetic Checklist Completed: patient identified, site marked, surgical consent, pre-op evaluation, timeout performed, IV checked, risks and benefits discussed and monitors and equipment checked  Epidural Patient position: sitting Prep: site prepped and draped and DuraPrep Patient monitoring: continuous pulse ox and blood pressure Approach: midline Injection technique: LOR air  Needle:  Needle type: Tuohy  Needle gauge: 17 G Needle length: 9 cm and 9 Needle insertion depth: 6 cm Catheter type: closed end flexible Catheter size: 19 Gauge Catheter at skin depth: 11 cm Test dose: negative and Other  Assessment Sensory level: T8 Events: blood not aspirated, injection not painful, no injection resistance, negative IV test and no paresthesia  Additional Notes Reason for block:procedure for pain

## 2011-09-10 NOTE — MAU Note (Signed)
Patient states she is having contractions every 3 minutes with bloody show. Reports good fetal movement, no leaking.

## 2011-09-10 NOTE — MAU Note (Signed)
Pt C/O uc's since 0200, bloody show, denies LOF.

## 2011-09-10 NOTE — Progress Notes (Signed)
Patient ID: Monica Mclaughlin, female   DOB: Feb 03, 1990, 21 y.o.   MRN: [id number] .Subjective:  Comfortable w epidural  Objective: BP 123/68  Pulse 61  Temp 98.5 F (36.9 C) (Oral)  Resp 18  Ht 5\' 2"  (1.575 m)  Wt 171 lb (77.565 kg)  BMI 31.28 kg/m2  SpO2 100%  LMP 12/08/2010  Breastfeeding? Unknown   FHT:  FHR: 100 bpm, variability: moderate,  accelerations:  Present,  decelerations:  Present occ mild variables UC:   regular, every 2 minutes SVE:  Deferred     Assessment / Plan: Spontaneous labor, progressing normally GBS pos rcv'd PCN   Fetal Wellbeing:  Category I low baseline noted, but overall reassuring Pain Control:  Epidural  Update physician PRN  Malissa Hippo 09/10/2011, 7:38 PM

## 2011-09-10 NOTE — Progress Notes (Signed)
Patient ID: Monica Mclaughlin, female   DOB: January 02, 1991, 21 y.o.   MRN: [id number] .Subjective: Pt comfortable w epidural   Objective: BP 115/55  Pulse 77  Temp 98.5 F (36.9 C) (Oral)  Resp 18  Ht 5\' 2"  (1.575 m)  Wt 171 lb (77.565 kg)  BMI 31.28 kg/m2  SpO2 100%  LMP 12/08/2010  Breastfeeding? Unknown   FHT:  FHR: 110 bpm, variability: moderate,  accelerations:  Present,  decelerations:  Present early variables UC:   regular, every 2 minutes SVE:   10/100/+1,  AROM for lg amt clear fluid   Assessment / Plan: Spontaneous labor, progressing normally GBS pos, rcv'd PCN Will allow to labor down until urge to push  Fetal Wellbeing:  Category I Pain Control:  Epidural  Update physician PRN  Malissa Hippo 09/10/2011, 6:47 PM

## 2011-09-10 NOTE — H&P (Signed)
Monica Mclaughlin is a 21 y.o. female presenting for contractions since 2am., also having bloody show, denies LOF. Reports GFM  HPI: Pt began PNC at [facility] at 10wks. PT was seen in the ED at about [redacted]wks pregnant for an episode of syncope, it was noted she had an irregular heart rhythm at that time and EKG was done, which states in the note from the ED provider it was normal. Pt did not have any further cardiac workup during this pregnancy, did not have any further syncope. Pt was tx'd for BV at that time. She had an anat [place] at 19wks and had a normal quad screen. 1hr gtt was normal at 29wks. GBS was pos at 36wks, GC/CT were normal. Otherwise routine prenatal course.   Maternal Medical History:  Reason for admission: Reason for admission: contractions.  Contractions: Onset was 6-12 hours ago.   Frequency: regular.   Duration is approximately 60 seconds.   Perceived severity is moderate.    Fetal activity: Perceived fetal activity is normal.   Last perceived fetal movement was within the past hour.    Prenatal complications: no prenatal complications   OB History    Grav Para Term Preterm Abortions TAB SAB Ect Mult Living   1 1 1  0 0 0 0 0 0 1     Past Medical History  Diagnosis Date  . Abnormal Pap smear 2011  . Candidiasis   . Sickle cell trait   . H/O dysmenorrhea   . Monilial vulvovaginitis 08/2005  . 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 Dysplasia of cervix, low grade (CIN 1) 2008  . Vulvovaginitis   . ASCUS (atypical squamous cells of undetermined significance) on Pap smear   . Dysplasia of cervix, low grade (CIN 1)    Past Surgical History  Procedure Date  . Wisdom tooth extraction    Family History: family history includes Alcohol abuse in her father; Asthma in her mother; Cancer in her maternal grandfather; Diabetes in her maternal grandfather; and Hypertension in her maternal aunt, maternal grandmother, and mother. Social History:  reports that she has been smoking  Cigarettes.  She has never used smokeless tobacco. She reports that she does not drink alcohol or use illicit drugs.   Prenatal Transfer Tool  Maternal Diabetes: No Genetic Screening: Normal Maternal Ultrasounds/Referrals: Normal Fetal Ultrasounds or other Referrals:  None Maternal Substance Abuse:  Yes:  Type: Smoker Significant Maternal Medications:  None Significant Maternal Lab Results:  Lab values include: Group B Strep positive Other Comments:  None  Review of Systems  All other systems reviewed and are negative.    Dilation: 10 Effacement (%): 100 Station: 0;+1 Exam by:: R Simpson RN Blood pressure 115/55, pulse 77, temperature 98.5 F (36.9 C), temperature source Oral, resp. rate 18, height 5\' 2"  (1.575 m), weight 171 lb (77.565 kg), last menstrual period 12/08/2010, SpO2 100.00%, unknown if currently breastfeeding. Maternal Exam:  Uterine Assessment: Contraction strength is moderate.  Contraction duration is 60 seconds. Contraction frequency is regular.   Abdomen: Patient reports no abdominal tenderness. Fundal height is aga.   Estimated fetal weight is 6-7.   Fetal presentation: vertex  Introitus: Normal vulva. Vagina is positive for vaginal discharge.  Pelvis: adequate for delivery.   Cervix: Cervix evaluated by digital exam.     Fetal Exam Fetal Monitor Review: Mode: ultrasound.   Baseline rate: 110.  Variability: moderate (6-25 bpm).   Pattern: accelerations present and variable decelerations.    Fetal State  Assessment: Category I - tracings are normal.     Physical Exam  Nursing note and vitals reviewed. Constitutional: She is oriented to person, place, and time. She appears well-developed and well-nourished. She appears distressed.       Pt breathing w ctx, grimaces   HENT:  Head: Normocephalic.  Neck: Normal range of motion.  Cardiovascular: Normal rate.        Irregular rhythm  Pt states she's been told she has irregular rhythm  Respiratory:  Effort normal and breath sounds normal.  GI: Soft. Bowel sounds are normal.  Genitourinary: Vaginal discharge found.       Bloody show  Musculoskeletal: Normal range of motion. She exhibits no edema.  Neurological: She is alert and oriented to person, place, and time.  Skin: Skin is warm and dry.  Psychiatric: She has a normal mood and affect. Her behavior is normal.    Prenatal labs: ABO, Rh: O/Positive/-- (09/05 0000) Antibody: Negative (09/05 0000) Rubella: Immune (09/05 0000) RPR: Nonreactive (09/05 0000)  HBsAg: Negative (09/05 0000)  HIV: Non-reactive (09/05 0000)  GBS: POSITIVE (08/08 1057)  Quad - normal 1hr gtt=113, RPR NR, hgb =10.2 GC/CT neg  Assessment/Plan: IUP at [redacted]w[redacted]d  GBS pos FHR overall reassuring GBS pos  Admit to b.s per c/w Dr Normand Sloop Routine L&D orders Epidural when pt desires PCN for GBS prophylaxis     Jeylin Woodmansee M 09/10/2011, 6:51 PM

## 2011-09-10 NOTE — Anesthesia Preprocedure Evaluation (Signed)

## 2011-09-11 ENCOUNTER — Ambulatory Visit (INDEPENDENT_AMBULATORY_CARE_PROVIDER_SITE_OTHER): Payer: Medicaid Other | Admitting: Obstetrics and Gynecology

## 2011-09-11 DIAGNOSIS — Z331 Pregnant state, incidental: Secondary | ICD-10-CM

## 2011-09-11 LAB — CBC
HCT: 31.5 % — ABNORMAL LOW (ref 36.0–46.0)
MCH: 26.9 pg (ref 26.0–34.0)
MCHC: 33 g/dL (ref 30.0–36.0)
MCV: 81.6 fL (ref 78.0–100.0)
Platelets: 169 10*3/uL (ref 150–400)
RDW: 14.3 % (ref 11.5–15.5)

## 2011-09-11 MED ORDER — PNEUMOCOCCAL 13-VAL CONJ VACC IM SUSP: 0.5000 mL | INTRAMUSCULAR | Status: DC

## 2011-09-11 MED ORDER — PNEUMOCOCCAL VAC POLYVALENT 25 MCG/0.5ML IJ INJ
0.5000 mL | INJECTION | INTRAMUSCULAR | Status: AC
  Administered 2011-09-12: 0.5 mL via INTRAMUSCULAR
  Filled 2011-09-11: qty 0.5

## 2011-09-11 NOTE — Progress Notes (Signed)
UR Chart review completed.  

## 2011-09-11 NOTE — Anesthesia Postprocedure Evaluation (Signed)
Anesthesia Post Note  Patient: Monica Mclaughlin  Procedure(s) Performed: * No procedures listed *  Anesthesia type: Epidural  Patient location: Mother/Baby  Post pain: Pain level controlled  Post assessment: Post-op Vital signs reviewed  Last Vitals:  Filed Vitals:   09/11/11 0910  BP: 118/75  Pulse: 51  Temp: 36.9 C  Resp: 20    Post vital signs: Reviewed  Level of consciousness: awake  Complications: No apparent anesthesia complications

## 2011-09-12 MED ORDER — IBUPROFEN 600 MG PO TABS: 600.0000 mg | ORAL_TABLET | Freq: Four times a day (QID) | ORAL | Status: AC

## 2011-09-12 MED ORDER — OXYCODONE-ACETAMINOPHEN 5-325 MG PO TABS: 1.0000 | ORAL_TABLET | Freq: Four times a day (QID) | ORAL | Status: AC | PRN

## 2011-09-12 NOTE — Discharge Summary (Signed)
Obstetric Discharge Summary Reason for Admission: onset of labor Prenatal Procedures: ultrasound Intrapartum Procedures: spontaneous vaginal delivery Postpartum Procedures: none Complications-Operative and Postpartum: 2nd degree perineal laceration Hemoglobin  Date Value Range Status  09/11/2011 10.4* 12.0 - 15.0 g/dL Final     DELTA CHECK NOTED     REPEATED TO VERIFY     HCT  Date Value Range Status  09/11/2011 31.5* 36.0 - 46.0 % Final    Physical Exam:  General: alert, cooperative and no distress Heart:  RRR Lungs:  CTA bilat Abd:  Soft, NT with pos BS x 4 quads Lochia: appropriate, sm rubra Uterine Fundus: firm, NT 2 below umb Incision: healing well DVT Evaluation: No evidence of DVT seen on physical exam. Negative Homan's sign. No significant calf/ankle edema.  Discharge Diagnoses: Term Pregnancy-delivered  Discharge Information: Date: 09/12/2011 Activity: unrestricted Diet: routine Medications: PNV, Ibuprofen and Percocet Condition: stable Instructions: refer to practice specific booklet Discharge to: home Follow-up Information    Follow up with [facility] in 6 weeks. (Call for an appointment)        Contraception:  Plans Implanon  Newborn Data: Live born female  Birth Weight: 6 lb 0.5 oz (2735 g) APGAR: 9, 9  Home with mother.  Monica Barbe O. 09/12/2011, 12:35 PM

## 2011-09-12 NOTE — Progress Notes (Signed)
Post Partum Day 2 Subjective: Feels well.  Ambulating, voiding and tol po liquids and solids without difficulty.  Pos flatus and pos BM.  Formula feeding.    Objective: Blood pressure 96/58, pulse 53, temperature 97.5 F (36.4 C), temperature source Oral, resp. rate 18, height 5\' 2"  (1.575 m), weight 77.565 kg (171 lb), last menstrual period 12/08/2010, SpO2 97.00%, unknown if currently breastfeeding.  Physical Exam:  General: alert, cooperative and no distress Heart:  RRR Lungs: CTA bilat Abd:  Soft, NT with pos BS x 4 quads Lochia: appropriate, sm rubra Uterine Fundus: firm, NT 2 below umb. Incision: healing well DVT Evaluation: No evidence of DVT seen on physical exam. Negative Homan's sign bilat. No significant calf/ankle edema.   Basename 09/11/11 0545 09/10/11 1030  HGB 10.4* 13.1  HCT 31.5* 39.5    Assessment/Plan: Stable s/p vag delivery at 39w 3d  Discharge to home. Discharge instructions reviewed. Plans Implanon for contraception. Rx:  Ibuprofen, Percocet, PNV RTO 4wks for f/u.   LOS: 2 days   Monica Mclaughlin. 09/12/2011, 12:29 PM

## 2011-09-14 NOTE — Progress Notes (Signed)
DNKA

## 2011-09-15 ENCOUNTER — Telehealth: Payer: Self-pay | Admitting: Obstetrics and Gynecology

## 2011-09-15 NOTE — Telephone Encounter (Signed)
Message copied by Earl Gala on Tue Sep 15, 2011 12:43 PM ------      Message from: Osborn Coho      Created: Mon Sep 14, 2011  7:22 PM      Regarding: Progress Note from 09/11/11       This is the patient that I had mentioned you probably forgot to write a progress note on from 09/11/11.  Please complete so her record can be complete.       Thank you

## 2011-10-08 ENCOUNTER — Telehealth: Payer: Self-pay | Admitting: Obstetrics and Gynecology

## 2011-10-08 NOTE — Telephone Encounter (Signed)
Pc to pt per telephone call. Informed pt a letter cannot be written when to return to work w/o evaluation of pt first to determine that. Pt informed can provide a letter stating post partum exam not sched til 10/23/11 and will determine return to work date after evaluation. Pt agrees.

## 2011-10-23 ENCOUNTER — Ambulatory Visit (INDEPENDENT_AMBULATORY_CARE_PROVIDER_SITE_OTHER): Payer: Medicaid Other | Admitting: Obstetrics and Gynecology

## 2011-10-23 ENCOUNTER — Encounter: Payer: Self-pay | Admitting: Obstetrics and Gynecology

## 2011-10-23 DIAGNOSIS — N760 Acute vaginitis: Secondary | ICD-10-CM

## 2011-10-23 DIAGNOSIS — B9689 Other specified bacterial agents as the cause of diseases classified elsewhere: Secondary | ICD-10-CM

## 2011-10-23 DIAGNOSIS — A499 Bacterial infection, unspecified: Secondary | ICD-10-CM

## 2011-10-23 LAB — POCT WET PREP (WET MOUNT)
Clue Cells Wet Prep Whiff POC: NEGATIVE
pH: 5

## 2011-10-23 MED ORDER — METRONIDAZOLE 500 MG PO TABS
500.0000 mg | ORAL_TABLET | Freq: Two times a day (BID) | ORAL | Status: AC
Start: 2011-10-23 — End: 2011-10-30

## 2011-10-23 NOTE — Progress Notes (Signed)
Monica Mclaughlin  is 6 weeks postpartum following a spontaneous vaginal delivery at 36 gestational weeks Date: 09/10/11 female baby named Bosie Clos delivered by SL.  Breastfeeding: no Bottlefeeding:  yes  Post-partum blues / depression:  no  EPDS score: 2 History of abnormal Pap:  yes 01/01/2009 Last Pap: Date 02/20/11 Gestational diabetes:  no  Contraception:  Desires Nexplanon  Normal urinary function:  yes Normal GI function:  yes Returning to work: Yes on 11/02/11 needs note

## 2011-10-23 NOTE — Progress Notes (Signed)
Monica Mclaughlin is a 21 y.o. female who presents for a postpartum visit.   Type of delivery:  SVB, with SL, 2nd degree laceration  Patient reports doing well.  Wants Nexplanon.  Does have vaginal discharge with odor.  Hx remarkable for: Patient Active Problem List  Diagnosis  . Shellfish allergy  . Smoker  . Irregular heart beats  . Vaginal delivery      PPDS = 2--denies any pp depression  Contraception plan:  Nexplanon   I have fully reviewed the prenatal and intrapartum course   Patient has not been sexually active since delivery.   The following portions of the patient's history were reviewed and updated as appropriate: allergies, current medications, past family history, past medical history, past social history, past surgical history and problem list.  Review of Systems Pertinent items are noted in HPI.   Objective:    BP 100/78  Resp 16  Wt 160 lb (72.576 kg)  LMP 10/16/2011  Breastfeeding? No  General:  alert, cooperative and no distress     Lungs: clear to auscultation bilaterally  Heart:  regular rate and rhythm, S1, S2 normal, no murmur  Abdomen: soft, non-tender; bowel sounds normal; no masses,  no organomegaly.   Incision:  NA   Vulva:  normal  Vagina: normal vagina, well-healed 2nd degree laceration.  Moderate amount of creamy white d/c with odor  Cervix:  normal  Uterus: normal size, contour, position, consistency, mobility, non-tender, well-involuted  Adnexa:  normal adnexa             Assessment:     Normal postpartum exam.  Pap smear not done at today's visit.  Due early 2014. BV  Plan:  Follow-up for Nexplanon appt in next 1-2 weeks. Rxs:  Metronidazole 500 mg po BID x 7 days.  Nigel Bridgeman CNM, [place] 10/24/2011 6:59 AM

## 2011-11-03 ENCOUNTER — Ambulatory Visit (INDEPENDENT_AMBULATORY_CARE_PROVIDER_SITE_OTHER): Payer: Medicaid Other | Admitting: Obstetrics and Gynecology

## 2011-11-03 ENCOUNTER — Encounter: Payer: Self-pay | Admitting: Obstetrics and Gynecology

## 2011-11-03 VITALS — BP 110/70 | Wt 154.0 lb

## 2011-11-03 DIAGNOSIS — Z30017 Encounter for initial prescription of implantable subdermal contraceptive: Secondary | ICD-10-CM

## 2011-11-03 LAB — POCT URINE PREGNANCY: Preg Test, Ur: NEGATIVE

## 2011-11-03 MED ORDER — ETONOGESTREL 68 MG ~~LOC~~ IMPL
68.0000 mg | DRUG_IMPLANT | Freq: Once | SUBCUTANEOUS | Status: AC
  Administered 2011-11-03: 68 mg via SUBCUTANEOUS

## 2011-11-03 NOTE — Progress Notes (Addendum)
21 YO for Nexplanon insertion.   O: Nexplanon inserted per protocol in medial left upper arm without difficulty; rod palpated by clinician and patient;   dressed with sterile band-aid, 4 x 4  gauze and Kling pressure dressing.  UPT-negative  Nexplanon Lot # [id number]  A:  Nexplanon Insetion  P:  Reviewed signs and symptoms of infection and wound care.       RTO-1 week for follow up      May return to work November 09, 2011  [place], [place]

## 2011-11-03 NOTE — Patient Instructions (Signed)
Call [facility] [phone]:  -for temperature of 100.4 degrees Fahrenheit or more -pain not improved with over the counter pain medications (Ibuprofen, Advil, Aleve,     Tylenol or acetaminophen) -for excessive bleeding from insertion site -for excessive swelling redness or green drainage from your insertion site -for any other concerns -keep insertion site clean, dry and covered  for 24 hours -you may remove pressure bandage in 1-4 hours  Use a back-up method of birth control for the next 4 weeks  

## 2011-11-03 NOTE — Addendum Note (Signed)
Addended byWinfred Leeds on: 11/03/2011 12:40 PM   Modules accepted: Orders

## 2011-11-13 ENCOUNTER — Encounter: Payer: Self-pay | Admitting: Obstetrics and Gynecology

## 2011-11-13 ENCOUNTER — Ambulatory Visit (INDEPENDENT_AMBULATORY_CARE_PROVIDER_SITE_OTHER): Payer: Medicaid Other | Admitting: Obstetrics and Gynecology

## 2011-11-13 VITALS — BP 120/70 | Wt 150.0 lb

## 2011-11-13 DIAGNOSIS — Z3040 Encounter for surveillance of contraceptives, unspecified: Secondary | ICD-10-CM

## 2011-11-13 NOTE — Progress Notes (Signed)
F/u nexplanon  Pt w/o complaint  BP 120/70  Wt 150 lb (68.04 kg)  LMP 10/16/2011  Breastfeeding? No Pt without c/o Physical Examination: General appearance - alert, well appearing, and in no distress and chronically ill appearing Chest - clear to auscultation, no wheezes, rales or rhonchi, symmetric air entry Heart - normal rate and regular rhythm nexplanon palpated in pts arem RT Feb for AEX

## 2012-04-04 ENCOUNTER — Emergency Department (INDEPENDENT_AMBULATORY_CARE_PROVIDER_SITE_OTHER)
Admission: EM | Admit: 2012-04-04 | Discharge: 2012-04-04 | Disposition: A | Discharge status: Home / Self Care | Payer: Self-pay | Source: Home / Self Care | Attending: Emergency Medicine | Admitting: Emergency Medicine

## 2012-04-04 ENCOUNTER — Encounter (HOSPITAL_COMMUNITY): Payer: Self-pay | Admitting: *Deleted

## 2012-04-04 DIAGNOSIS — S239XXA Sprain of unspecified parts of thorax, initial encounter: Secondary | ICD-10-CM

## 2012-04-04 DIAGNOSIS — S39012A Strain of muscle, fascia and tendon of lower back, initial encounter: Secondary | ICD-10-CM

## 2012-04-04 DIAGNOSIS — S335XXA Sprain of ligaments of lumbar spine, initial encounter: Secondary | ICD-10-CM

## 2012-04-04 MED ORDER — CYCLOBENZAPRINE HCL 10 MG PO TABS: 10.0000 mg | ORAL_TABLET | Freq: Two times a day (BID) | ORAL | Status: DC | PRN

## 2012-04-04 MED ORDER — IBUPROFEN 400 MG PO TABS: 400.0000 mg | ORAL_TABLET | Freq: Four times a day (QID) | ORAL | Status: DC | PRN
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
## 2012-04-27 ENCOUNTER — Emergency Department (HOSPITAL_COMMUNITY)
Admission: EM | Admit: 2012-04-27 | Discharge: 2012-04-27 | Disposition: A | Discharge status: Home / Self Care | Payer: 59 | Source: Home / Self Care | Attending: Emergency Medicine | Admitting: Emergency Medicine

## 2012-04-27 ENCOUNTER — Other Ambulatory Visit (HOSPITAL_COMMUNITY)
Admission: RE | Admit: 2012-04-27 | Discharge: 2012-04-27 | Disposition: A | Discharge status: Home / Self Care | Payer: 59 | Source: Ambulatory Visit | Attending: Family Medicine | Admitting: Family Medicine

## 2012-04-27 ENCOUNTER — Encounter (HOSPITAL_COMMUNITY): Payer: Self-pay | Admitting: *Deleted

## 2012-04-27 DIAGNOSIS — N760 Acute vaginitis: Secondary | ICD-10-CM

## 2012-04-27 DIAGNOSIS — Z113 Encounter for screening for infections with a predominantly sexual mode of transmission: Secondary | ICD-10-CM | POA: Insufficient documentation

## 2012-04-27 DIAGNOSIS — A499 Bacterial infection, unspecified: Secondary | ICD-10-CM

## 2012-04-27 DIAGNOSIS — N390 Urinary tract infection, site not specified: Secondary | ICD-10-CM

## 2012-04-27 DIAGNOSIS — B9689 Other specified bacterial agents as the cause of diseases classified elsewhere: Secondary | ICD-10-CM

## 2012-04-27 LAB — POCT URINALYSIS DIP (DEVICE)
Ketones, ur: 15 mg/dL — AB
Protein, ur: 100 mg/dL — AB
Urobilinogen, UA: 0.2 mg/dL (ref 0.0–1.0)
pH: 6 (ref 5.0–8.0)

## 2012-04-27 MED ORDER — CEPHALEXIN 500 MG PO CAPS: 500.0000 mg | ORAL_CAPSULE | Freq: Four times a day (QID) | ORAL | Status: DC

## 2012-04-27 MED ORDER — METRONIDAZOLE 500 MG PO TABS: 500.0000 mg | ORAL_TABLET | Freq: Two times a day (BID) | ORAL | Status: DC

## 2012-04-27 NOTE — ED Provider Notes (Signed)
History     CSN: [id number]  Arrival date & time 04/27/12  1024   First MD Initiated Contact with Patient 04/27/12 1311      Chief Complaint  Patient presents with  . Urinary Frequency    (Consider location/radiation/quality/duration/timing/severity/associated sxs/prior treatment) Patient is a 22 y.o. female presenting with frequency and vaginal discharge. The history is provided by the patient.  Urinary Frequency This is a new problem. Episode onset: 5 days ago. The problem occurs constantly. The problem has been gradually worsening. Pertinent negatives include no abdominal pain. Exacerbated by: urinating. Nothing relieves the symptoms. She has tried nothing for the symptoms.  Vaginal Discharge This is a new problem. The current episode started more than 2 days ago. The problem occurs constantly. The problem has been gradually worsening. Pertinent negatives include no abdominal pain. Nothing aggravates the symptoms. Nothing relieves the symptoms. She has tried nothing for the symptoms.    Past Medical History  Diagnosis Date  . Abnormal Pap smear 2011  . Candidiasis   . Sickle cell trait   . H/O dysmenorrhea   . Monilial vulvovaginitis 08/2005  . 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 Dysplasia of cervix, low grade (CIN 1) 2008  . Vulvovaginitis   . ASCUS (atypical squamous cells of undetermined significance) on Pap smear   . Dysplasia of cervix, low grade (CIN 1)     Past Surgical History  Procedure Laterality Date  . Wisdom tooth extraction      Family History  Problem Relation Age of Onset  . Hypertension Mother   . Asthma Mother   . Alcohol abuse Father   . Hypertension Maternal Aunt   . Hypertension Maternal Grandmother   . Diabetes Maternal Grandfather   . Cancer Maternal Grandfather     History  Substance Use Topics  . Smoking status: Current Every Day Smoker    Types: Cigarettes  . Smokeless tobacco: Never Used     Comment: pack a week  . Alcohol  Use: No    OB History   Grav Para Term Preterm Abortions TAB SAB Ect Mult Living   1 1 1  0 0 0 0 0 0 1      Review of Systems  Constitutional: Negative for fever and chills.  Gastrointestinal: Negative for nausea, vomiting, abdominal pain, diarrhea and constipation.  Genitourinary: Positive for dysuria, frequency, hematuria and vaginal discharge. Negative for flank pain, vaginal bleeding and genital sores.    Allergies  Shellfish allergy  Home Medications   Current Outpatient Rx  Name  Route  Sig  Dispense  Refill  . cephALEXin (KEFLEX) 500 MG capsule   Oral   Take 1 capsule (500 mg total) by mouth 4 (four) times daily.   20 capsule   0   . cyclobenzaprine (FLEXERIL) 10 MG tablet   Oral   Take 1 tablet (10 mg total) by mouth 2 (two) times daily as needed for muscle spasms.   12 tablet   0   . Etonogestrel (NEXPLANON )   Subcutaneous   Inject into the skin.         Marland Kitchen ibuprofen (ADVIL,MOTRIN) 400 MG tablet   Oral   Take 1 tablet (400 mg total) by mouth every 6 (six) hours as needed for pain.   30 tablet   0   . metroNIDAZOLE (FLAGYL) 500 MG tablet   Oral   Take 1 tablet (500 mg total) by mouth 2 (two) times daily.   14 tablet  0   . Prenatal Vit-Fe Fumarate-FA (PRENATAL COMPLETE) 14-0.4 MG TABS   Oral   Take 1 tablet by mouth 1 day or 1 dose.   60 each   0     BP 124/76  Pulse 72  Temp(Src) 98.6 F (37 C) (Oral)  Resp 16  SpO2 100%  Physical Exam  Constitutional: She appears well-developed and well-nourished. No distress.  Cardiovascular: Normal rate and regular rhythm.   Pulmonary/Chest: Effort normal and breath sounds normal.  Abdominal: Soft. Bowel sounds are normal. She exhibits no distension. There is no tenderness. There is no rebound, no guarding and no CVA tenderness.  Genitourinary: Uterus normal. There is no rash, tenderness or lesion on the right labia. There is no rash, tenderness or lesion on the left labia. Cervix exhibits no  motion tenderness and no discharge. Right adnexum displays no mass and no tenderness. Left adnexum displays no mass and no tenderness. No erythema, tenderness or bleeding around the vagina. Vaginal discharge found.  Lymphadenopathy:       Right: No inguinal adenopathy present.       Left: No inguinal adenopathy present.    ED Course  Procedures (including critical care time)  Labs Reviewed  POCT URINALYSIS DIP (DEVICE) - Abnormal; Notable for the following:    Bilirubin Urine SMALL (*)    Ketones, ur 15 (*)    Hgb urine dipstick SMALL (*)    Protein, ur 100 (*)    Leukocytes, UA SMALL (*)    All other components within normal limits  POCT PREGNANCY, URINE  CERVICOVAGINAL ANCILLARY ONLY   No results found.   1. UTI (lower urinary tract infection)   2. Bacterial vaginosis       MDM  Vag d/c c/w bv, rx flagyl 500mg  BID #14. Sx c/w uti. Blood and +leuk on UA. Rx keflex 500mg  QID #20        Cathlyn Parsons, NP 04/27/12 1319

## 2012-04-27 NOTE — ED Provider Notes (Signed)
Medical screening examination/treatment/procedure(s) were performed by non-physician practitioner and as supervising physician I was immediately available for consultation/collaboration.  Raynald Blend, MD 04/27/12 1322

## 2012-04-27 NOTE — ED Notes (Signed)
Pt  Reports     Symptoms  Of  Urinary   Frequency  Burning on  Urination  As  Well  As  A  Vaginal  Discharge   For  sev  Days       Ambulated  To  Room  With a  Slow  Steady  Gait    Sitting  Upright on  Exam table  Speaking in  Complete  sentances   Pt  Also  Reports  L;ow  abd pain as  Well

## 2012-04-29 NOTE — ED Notes (Addendum)
GC/Chlamydia neg., Affirm: Gardnerella pos. Candida and Trich neg.  Pt. adequately treated with Flagyl. Monica Mclaughlin 04/29/2012

## 2012-08-17 ENCOUNTER — Emergency Department (HOSPITAL_COMMUNITY)
Admission: EM | Admit: 2012-08-17 | Discharge: 2012-08-17 | Disposition: A | Discharge status: Home / Self Care | Payer: 59 | Source: Home / Self Care | Attending: Emergency Medicine | Admitting: Emergency Medicine

## 2012-08-17 ENCOUNTER — Encounter (HOSPITAL_COMMUNITY): Payer: Self-pay | Admitting: Emergency Medicine

## 2012-08-17 DIAGNOSIS — H109 Unspecified conjunctivitis: Secondary | ICD-10-CM

## 2012-08-17 MED ORDER — TOBRAMYCIN 0.3 % OP SOLN: 1.0000 [drp] | OPHTHALMIC | Status: DC

## 2012-08-17 NOTE — ED Notes (Signed)
Pt c/o bilateral pink eye onset last night  Reports she was taking care of her baby cousin who had pink eye sxs include: itchiness, blurry vision... Denies fevers Alert w/no signs of acute distress.

## 2012-08-17 NOTE — ED Provider Notes (Signed)
Chief Complaint:   Chief Complaint  Patient presents with  . Conjunctivitis    History of Present Illness:   Monica Mclaughlin is a 22 year old female who has a two-day history of bilateral red, irritated, itchy, burning eyes. She denies any eye pain. She has had some yellow-green discharge and some matting of her eyelids. Her vision is normal there's been no photophobia. She was exposed to a cousin's child who had conjunctivitis. She denies any other URI symptoms such as fever, chills, headache, earache, nasal congestion, rhinorrhea, sneezing, sore throat, adenopathy, or cough.  Review of Systems:  Other than noted above, the patient denies any of the following symptoms: Systemic:  No fever, chills, sweats, fatigue, or weight loss. Eye:  No redness, eye pain, photophobia, discharge, blurred vision, or diplopia. ENT:  No nasal congestion, rhinorrhea, or sore throat. Lymphatic:  No adenopathy. Skin:  No rash or pruritis.  PMFSH:  Past medical history, family history, social history, meds, and allergies were reviewed.    Physical Exam:   Vital signs:  Breastfeeding? No General:  Alert and in no distress. Eye:  The eyelids were normal. Conjunctiva is were injected and a small amount of yellowish drainage in the left conjunctival sac. The cornea was normal, anterior chambers normal, funduscopic exam normal, PERRLA, full EOMs. ENT:  TMs and canals clear.  Nasal mucosa normal.  No intra-oral lesions, mucous membranes moist, pharynx clear. Neck:  No adenopathy tenderness or mass. Skin:  Clear, warm and dry.  Assessment:  The encounter diagnosis was Conjunctivitis.  Viral versus bacterial.  Plan:   1.  The following meds were prescribed:   Discharge Medication List as of 08/17/2012  9:55 AM    START taking these medications   Details  tobramycin (TOBREX) 0.3 % ophthalmic solution Place 1 drop into both eyes every 4 (four) hours., Starting 08/17/2012, Until Discontinued, Normal       2.   The patient was instructed in symptomatic care and handouts were given. 3.  The patient was told to return if becoming worse in any way, if no better in 3 or 4 days, and given some red flag symptoms such as pain or any difficulty with vision that would indicate earlier return. 4.  Follow up here if necessary.     Reuben Likes, MD 08/17/12 [phone]

## 2013-01-14 ENCOUNTER — Encounter (HOSPITAL_COMMUNITY): Payer: Self-pay | Admitting: Emergency Medicine

## 2013-01-14 ENCOUNTER — Emergency Department (HOSPITAL_COMMUNITY)
Admission: EM | Admit: 2013-01-14 | Discharge: 2013-01-14 | Disposition: A | Discharge status: Home / Self Care | Payer: 59 | Source: Home / Self Care | Attending: Emergency Medicine | Admitting: Emergency Medicine

## 2013-01-14 ENCOUNTER — Other Ambulatory Visit (HOSPITAL_COMMUNITY)
Admission: RE | Admit: 2013-01-14 | Discharge: 2013-01-14 | Disposition: A | Discharge status: Home / Self Care | Payer: 59 | Source: Ambulatory Visit | Attending: Emergency Medicine | Admitting: Emergency Medicine

## 2013-01-14 DIAGNOSIS — B9689 Other specified bacterial agents as the cause of diseases classified elsewhere: Secondary | ICD-10-CM

## 2013-01-14 DIAGNOSIS — N760 Acute vaginitis: Secondary | ICD-10-CM

## 2013-01-14 DIAGNOSIS — Z113 Encounter for screening for infections with a predominantly sexual mode of transmission: Secondary | ICD-10-CM | POA: Insufficient documentation

## 2013-01-14 DIAGNOSIS — A499 Bacterial infection, unspecified: Secondary | ICD-10-CM

## 2013-01-14 LAB — POCT URINALYSIS DIP (DEVICE)
BILIRUBIN URINE: NEGATIVE
GLUCOSE, UA: NEGATIVE mg/dL
HGB URINE DIPSTICK: NEGATIVE
Ketones, ur: NEGATIVE mg/dL
NITRITE: NEGATIVE
Protein, ur: NEGATIVE mg/dL
Specific Gravity, Urine: 1.02 (ref 1.005–1.030)
Urobilinogen, UA: 0.2 mg/dL (ref 0.0–1.0)
pH: 8 (ref 5.0–8.0)

## 2013-01-14 LAB — POCT PREGNANCY, URINE: Preg Test, Ur: NEGATIVE

## 2013-01-14 MED ORDER — METRONIDAZOLE 0.75 % VA GEL: 1.0000 | Freq: Two times a day (BID) | VAGINAL | Status: DC

## 2013-01-14 NOTE — ED Provider Notes (Signed)
Chief Complaint:   Chief Complaint  Patient presents with  . Vaginal Bleeding    History of Present Illness:   Monica Mclaughlin is a 23 year old female who has had a Nexplanon for about a year. During this time she's had irregular menses with menstrual periods lasting up to a couple weeks at a time. The patient notes this time she's had bleeding continuously for the past 4 weeks. The bleeding is light and spotting without any clots or tissue. The blood seems to be old and dark with some odor. She denies any vaginal itching or vaginal pain. She's had no pelvic or abdominal pain, no lower back pain. No fever, chills, nausea, vomiting, or urinary symptoms. She denies any sexual activity in the last 2 months.  Review of Systems:  Other than noted above, the patient denies any of the following symptoms: Systemic:  No fever, chills, sweats, or weight loss. GI:  No abdominal pain, nausea, anorexia, vomiting, diarrhea, constipation, melena or hematochezia. GU:  No dysuria, frequency, urgency, hematuria, vaginal discharge, itching, or abnormal vaginal bleeding. Skin:  No rash or itching.  PMFSH:  Past medical history, family history, social history, meds, and allergies were reviewed.   Physical Exam:   Vital signs:  BP 110/70  Pulse 99  Temp(Src) 98.3 F (36.8 C) (Oral)  Resp 18  SpO2 99% General:  Alert, oriented and in no distress. Lungs:  Breath sounds clear and equal bilaterally.  No wheezes, rales or rhonchi. Heart:  Regular rhythm.  No gallops or murmers. Abdomen:  Soft, flat and non-distended.  No organomegaly or mass.  No tenderness, guarding or rebound.  Bowel sounds normally active. Pelvic exam:  Normal external genitalia. Vaginal and cervical mucosa were normal. There was a moderate amount of malodorous, yellow, creamy discharge. There was no bleeding at all. She had no cervical motion pain, uterus was normal in size and shape and nontender, no adnexal tenderness or mass. DNA probes for  gonorrhea, Chlamydia, Trichomonas, Gardnerella, Candida were obtained. Skin:  Clear, warm and dry.  Labs:   Results for orders placed during the hospital encounter of 01/14/13  POCT URINALYSIS DIP (DEVICE)      Result Value Range   Glucose, UA NEGATIVE  NEGATIVE mg/dL   Bilirubin Urine NEGATIVE  NEGATIVE   Ketones, ur NEGATIVE  NEGATIVE mg/dL   Specific Gravity, Urine 1.020  1.005 - 1.030   Hgb urine dipstick NEGATIVE  NEGATIVE   pH 8.0  5.0 - 8.0   Protein, ur NEGATIVE  NEGATIVE mg/dL   Urobilinogen, UA 0.2  0.0 - 1.0 mg/dL   Nitrite NEGATIVE  NEGATIVE   Leukocytes, UA SMALL (*) NEGATIVE  POCT PREGNANCY, URINE      Result Value Range   Preg Test, Ur NEGATIVE  NEGATIVE    Assessment:  The encounter diagnosis was Bacterial vaginosis.  The irregular bleeding is probably due to the Nexplanon. Suggested she followup with her gynecologist about this. The vaginal discharge and older we can treat. She was given gel since she does not like the large metronidazole pills.  Plan:   1.  Meds:  The following meds were prescribed:   Discharge Medication List as of 01/14/2013 12:01 PM    START taking these medications   Details  metroNIDAZOLE (METROGEL) 0.75 % vaginal gel Place 1 Applicatorful vaginally 2 (two) times daily., Starting 01/14/2013, Until Discontinued, Normal        2.  Patient Education/Counseling:  The patient was given appropriate handouts, self care  instructions, and instructed in symptomatic relief.   3.  Follow up:  The patient was told to follow up if no better in 3 to 4 days, if becoming worse in any way, and given some red flag symptoms such as worsening bleeding or pelvic pain which would prompt immediate return.  Follow up with her gynecologist regarding the irregular bleeding in the Nexplanon.     Reuben Likes, MD 01/14/13 1435

## 2013-01-14 NOTE — Discharge Instructions (Signed)
Bacterial Vaginosis °Bacterial vaginosis is a vaginal infection that occurs when the normal balance of bacteria in the vagina is disrupted. It results from an overgrowth of certain bacteria. This is the most common vaginal infection in women of childbearing age. Treatment is important to prevent complications, especially in pregnant women, as it can cause a premature delivery. °CAUSES  °Bacterial vaginosis is caused by an increase in harmful bacteria that are normally present in smaller amounts in the vagina. Several different kinds of bacteria can cause bacterial vaginosis. However, the reason that the condition develops is not fully understood. °RISK FACTORS °Certain activities or behaviors can put you at an increased risk of developing bacterial vaginosis, including: °· Having a new sex partner or multiple sex partners. °· Douching. °· Using an intrauterine device (IUD) for contraception. °Women do not get bacterial vaginosis from toilet seats, bedding, swimming pools, or contact with objects around them. °SIGNS AND SYMPTOMS  °Some women with bacterial vaginosis have no signs or symptoms. Common symptoms include: °· Grey vaginal discharge. °· A fishlike odor with discharge, especially after sexual intercourse. °· Itching or burning of the vagina and vulva. °· Burning or pain with urination. °DIAGNOSIS  °Your health care provider will take a medical history and examine the vagina for signs of bacterial vaginosis. A sample of vaginal fluid may be taken. Your health care provider will look at this sample under a microscope to check for bacteria and abnormal cells. A vaginal pH test may also be done.  °TREATMENT  °Bacterial vaginosis may be treated with antibiotic medicines. These may be given in the form of a pill or a vaginal cream. A second round of antibiotics may be prescribed if the condition comes back after treatment.  °HOME CARE INSTRUCTIONS  °· Only take over-the-counter or prescription medicines as  directed by your health care provider. °· If antibiotic medicine was prescribed, take it as directed. Make sure you finish it even if you start to feel better. °· Do not have sex until treatment is completed. °· Tell all sexual partners that you have a vaginal infection. They should see their health care provider and be treated if they have problems, such as a mild rash or itching. °· Practice safe sex by using condoms and only having one sex partner. °SEEK MEDICAL CARE IF:  °· Your symptoms are not improving after 3 days of treatment. °· You have increased discharge or pain. °· You have a fever. °MAKE SURE YOU:  °· Understand these instructions. °· Will watch your condition. °· Will get help right away if you are not doing well or get worse. °FOR MORE INFORMATION  °Centers for Disease Control and Prevention, Division of STD Prevention: www.cdc.gov/std °American Sexual Health Association (ASHA): www.ashastd.org  °Document Released: 12/22/2004 Document Revised: 10/12/2012 Document Reviewed: 08/03/2012 °ExitCare® Patient Information ©2014 [employer]. ° ° °To restore the normal balance of "good bacteria" in your system.  Take a probiotic once daily.  These can be gotten over the counter at the drug store without a prescription and come under various brand names such as Culturelle, Align, Florastore, and Phillips.  The best thing to do is to ask your pharmacist to recommend a good probiotic that is not too expensive.  ° ° °

## 2013-01-14 NOTE — ED Notes (Signed)
Reports vaginal bleeding for 4 weeks, currently has darker blood and odor that is concerning patient.  Not sexually active for 2 months, denies urinary symptoms, denies any abdominal or back pain.

## 2013-01-18 ENCOUNTER — Telehealth (HOSPITAL_COMMUNITY): Payer: Self-pay | Admitting: Emergency Medicine

## 2013-01-18 MED ORDER — METRONIDAZOLE 500 MG PO TABS: ORAL_TABLET | ORAL | Status: DC

## 2013-01-18 NOTE — ED Notes (Signed)
Her DNA probe came back positive for Trichomonas. She was treated with metronidazole gel which would not be effective against Trichomonas. Therefore will need treatment with metronidazole 500 mg, #4, take all 4 pills at one time.  Reuben Likesavid C Cassie Shedlock, MD 01/18/13 2125

## 2013-01-18 NOTE — Telephone Encounter (Signed)
Message copied by Reuben LikesKELLER, Hendrix Console C on Wed Jan 18, 2013  9:24 PM ------      Message from: [name], SUZANNE M      Created: Wed Jan 18, 2013  9:19 PM      Regarding: lab       Have been told by other providers that Metrogel does not treat Trich, it has to be Flagyl po.  Let me know what you find out.      [name], Suzanne M      01/18/2013            ----- Message -----         From: Reuben Likesavid C Rewa Weissberg, MD         Sent: 01/16/2013   7:01 PM           To: Desiree LucySuzanne M York, RN            Her DNA probe came back positive for Trichomonas. She was adequately treated with metronidazole vaginal gel. We will need to call her to inform her of this result and make sure she informs her sex partner or partners and has them treated as well.            Leslee Homeavid Tarence Searcy      ----- Message -----         From: [facility]         Sent: 01/16/2013   1:47 PM           To: Reuben Likesavid C Duwayne Matters, MD                         ------

## 2013-01-19 ENCOUNTER — Telehealth (HOSPITAL_COMMUNITY): Payer: Self-pay | Admitting: *Deleted

## 2013-01-19 NOTE — ED Notes (Addendum)
I called pt. and left a message to call.  Call 1. 01/19/2013 Pt. called back.  Pt. verified x 2 and given results.  Pt. told she needs Flagyl for Trich and where to pick up Rx.   Pt. instructed to no alcohol while taking this medication. Pt. told to take it with food and to call back if she vomits the medication. Pt. instructed to notify her partner to be treated with Flagyl, no sex until you have finished your medication and your partner has been treated and to practice safe sex. [facility] by appointment.  Pt. Voiced understanding. 01/19/2013

## 2013-03-18 ENCOUNTER — Other Ambulatory Visit (HOSPITAL_COMMUNITY)
Admission: RE | Admit: 2013-03-18 | Discharge: 2013-03-18 | Disposition: A | Discharge status: Home / Self Care | Payer: 59 | Source: Ambulatory Visit | Attending: Family Medicine | Admitting: Family Medicine

## 2013-03-18 ENCOUNTER — Emergency Department (HOSPITAL_COMMUNITY)
Admission: EM | Admit: 2013-03-18 | Discharge: 2013-03-18 | Disposition: A | Discharge status: Home / Self Care | Payer: 59 | Source: Home / Self Care | Attending: Family Medicine | Admitting: Family Medicine

## 2013-03-18 ENCOUNTER — Encounter (HOSPITAL_COMMUNITY): Payer: Self-pay | Admitting: Emergency Medicine

## 2013-03-18 DIAGNOSIS — N760 Acute vaginitis: Secondary | ICD-10-CM | POA: Insufficient documentation

## 2013-03-18 DIAGNOSIS — Z113 Encounter for screening for infections with a predominantly sexual mode of transmission: Secondary | ICD-10-CM | POA: Insufficient documentation

## 2013-03-18 LAB — POCT URINALYSIS DIP (DEVICE)
BILIRUBIN URINE: NEGATIVE
Glucose, UA: NEGATIVE mg/dL
HGB URINE DIPSTICK: NEGATIVE
Ketones, ur: NEGATIVE mg/dL
NITRITE: NEGATIVE
PH: 6.5 (ref 5.0–8.0)
Protein, ur: NEGATIVE mg/dL
Specific Gravity, Urine: 1.02 (ref 1.005–1.030)
UROBILINOGEN UA: 0.2 mg/dL (ref 0.0–1.0)

## 2013-03-18 LAB — POCT PREGNANCY, URINE: Preg Test, Ur: NEGATIVE

## 2013-03-18 MED ORDER — METRONIDAZOLE 0.75 % VA GEL: 1.0000 | Freq: Every day | VAGINAL | Status: DC

## 2013-03-18 MED ORDER — METRONIDAZOLE 500 MG PO TABS: 500.0000 mg | ORAL_TABLET | Freq: Two times a day (BID) | ORAL | Status: DC

## 2013-03-18 NOTE — Discharge Instructions (Signed)
We will call with test results and treat as indicated °

## 2013-03-18 NOTE — ED Provider Notes (Signed)
CSN: [id number]     Arrival date & time 03/18/13  1018 History   First MD Initiated Contact with Patient 03/18/13 1122     Chief Complaint  Patient presents with  . Vaginal Discharge   (Consider location/radiation/quality/duration/timing/severity/associated sxs/prior Treatment) Patient is a 23 y.o. female presenting with vaginal discharge. The history is provided by the patient.  Vaginal Discharge Quality:  Malodorous and mucopurulent Severity:  Mild Onset quality:  Gradual Duration:  2 days Chronicity:  New Associated symptoms: no dysuria     Past Medical History  Diagnosis Date  . Abnormal Pap smear 2011  . Candidiasis   . Sickle cell trait   . H/O dysmenorrhea   . Monilial vulvovaginitis 08/2005  . 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 Dysplasia of cervix, low grade (CIN 1) 2008  . Vulvovaginitis   . ASCUS (atypical squamous cells of undetermined significance) on Pap smear   . Dysplasia of cervix, low grade (CIN 1)    Past Surgical History  Procedure Laterality Date  . Wisdom tooth extraction     Family History  Problem Relation Age of Onset  . Hypertension Mother   . Asthma Mother   . Alcohol abuse Father   . Hypertension Maternal Aunt   . Hypertension Maternal Grandmother   . Diabetes Maternal Grandfather   . Cancer Maternal Grandfather    History  Substance Use Topics  . Smoking status: Current Every Day Smoker    Types: Cigarettes  . Smokeless tobacco: Never Used     Comment: pack a week  . Alcohol Use: No   OB History   Grav Para Term Preterm Abortions TAB SAB Ect Mult Living   1 1 1  0 0 0 0 0 0 1     Review of Systems  Constitutional: Negative.   Gastrointestinal: Negative.   Genitourinary: Positive for vaginal bleeding, vaginal discharge and menstrual problem. Negative for dysuria and vaginal pain.    Allergies  Shellfish allergy  Home Medications   Current Outpatient Rx  Name  Route  Sig  Dispense  Refill  . cephALEXin (KEFLEX)  500 MG capsule   Oral   Take 1 capsule (500 mg total) by mouth 4 (four) times daily.   20 capsule   0   . cyclobenzaprine (FLEXERIL) 10 MG tablet   Oral   Take 1 tablet (10 mg total) by mouth 2 (two) times daily as needed for muscle spasms.   12 tablet   0   . Etonogestrel (NEXPLANON [place])   Subcutaneous   Inject into the skin.         Marland Kitchen ibuprofen (ADVIL,MOTRIN) 400 MG tablet   Oral   Take 1 tablet (400 mg total) by mouth every 6 (six) hours as needed for pain.   30 tablet   0   . metroNIDAZOLE (FLAGYL) 500 MG tablet   Oral   Take 1 tablet (500 mg total) by mouth 2 (two) times daily.   14 tablet   0   . metroNIDAZOLE (FLAGYL) 500 MG tablet      Take all 4 pills at one time.   4 tablet   0   . metroNIDAZOLE (FLAGYL) 500 MG tablet   Oral   Take 1 tablet (500 mg total) by mouth 2 (two) times daily.   14 tablet   0   . metroNIDAZOLE (METROGEL VAGINAL) 0.75 % vaginal gel   Vaginal   Place 1 Applicatorful vaginally at bedtime. For 5 nights  70 g   0   . metroNIDAZOLE (METROGEL) 0.75 % vaginal gel   Vaginal   Place 1 Applicatorful vaginally 2 (two) times daily.   70 g   3   . Prenatal Vit-Fe Fumarate-FA (PRENATAL COMPLETE) 14-0.4 MG TABS   Oral   Take 1 tablet by mouth 1 day or 1 dose.   60 each   0   . tobramycin (TOBREX) 0.3 % ophthalmic solution   Both Eyes   Place 1 drop into both eyes every 4 (four) hours.   5 mL   0    BP 118/72  Pulse 61  Temp(Src) 99.1 F (37.3 C) (Oral)  Resp 18  SpO2 100% Physical Exam  Nursing note and vitals reviewed. Constitutional: She is oriented to person, place, and time. She appears well-developed and well-nourished.  Abdominal: Soft. Bowel sounds are normal.  Genitourinary: Uterus normal. Vaginal discharge found.  Pos whiff test.  Neurological: She is alert and oriented to person, place, and time.  Skin: Skin is warm and dry.    ED Course  Procedures (including critical care time) Labs Review Labs  Reviewed  POCT URINALYSIS DIP (DEVICE) - Abnormal; Notable for the following:    Leukocytes, UA SMALL (*)    All other components within normal limits  POCT PREGNANCY, URINE  CERVICOVAGINAL ANCILLARY ONLY   Imaging Review No results found.   MDM   1. Vaginitis        Linna HoffJames D Rayaan Garguilo, MD 03/18/13 1149

## 2013-03-18 NOTE — ED Notes (Signed)
Pt  Reports  Symptoms       Of  Vag  Discharge       And  Bleeding  With  Low  abd  Cramps    X  2  Days     Ambulated  Upright  With  Steady  Fluid  Gait

## 2013-03-20 LAB — CERVICOVAGINAL ANCILLARY ONLY
Chlamydia: NEGATIVE
Neisseria Gonorrhea: NEGATIVE
WET PREP (BD AFFIRM): NEGATIVE
Wet Prep (BD Affirm): NEGATIVE
Wet Prep (BD Affirm): POSITIVE — AB

## 2013-03-21 NOTE — ED Notes (Signed)
GC/Chlamydia neg., Affirm: Candida and Trich neg., Gardnerella pos.  Pt. adequately treated with Flagyl and Metrogel. [name], [name] M 03/21/2013

## 2013-04-23 ENCOUNTER — Encounter (HOSPITAL_COMMUNITY): Payer: Self-pay | Admitting: Emergency Medicine

## 2013-04-23 ENCOUNTER — Emergency Department (HOSPITAL_COMMUNITY)
Admission: EM | Admit: 2013-04-23 | Discharge: 2013-04-23 | Disposition: A | Discharge status: Home / Self Care | Payer: 59 | Attending: Emergency Medicine | Admitting: Emergency Medicine

## 2013-04-23 DIAGNOSIS — Z79899 Other long term (current) drug therapy: Secondary | ICD-10-CM | POA: Insufficient documentation

## 2013-04-23 DIAGNOSIS — B373 Candidiasis of vulva and vagina: Secondary | ICD-10-CM | POA: Insufficient documentation

## 2013-04-23 DIAGNOSIS — Z792 Long term (current) use of antibiotics: Secondary | ICD-10-CM | POA: Insufficient documentation

## 2013-04-23 DIAGNOSIS — N760 Acute vaginitis: Secondary | ICD-10-CM | POA: Insufficient documentation

## 2013-04-23 DIAGNOSIS — N870 Mild cervical dysplasia: Secondary | ICD-10-CM | POA: Insufficient documentation

## 2013-04-23 DIAGNOSIS — B3731 Acute candidiasis of vulva and vagina: Secondary | ICD-10-CM | POA: Insufficient documentation

## 2013-04-23 DIAGNOSIS — F17200 Nicotine dependence, unspecified, uncomplicated: Secondary | ICD-10-CM | POA: Insufficient documentation

## 2013-04-23 DIAGNOSIS — L259 Unspecified contact dermatitis, unspecified cause: Secondary | ICD-10-CM | POA: Insufficient documentation

## 2013-04-23 DIAGNOSIS — D573 Sickle-cell trait: Secondary | ICD-10-CM | POA: Insufficient documentation

## 2013-04-23 DIAGNOSIS — Z8742 Personal history of other diseases of the female genital tract: Secondary | ICD-10-CM | POA: Insufficient documentation

## 2013-04-23 DIAGNOSIS — R6889 Other general symptoms and signs: Secondary | ICD-10-CM | POA: Insufficient documentation

## 2013-04-23 DIAGNOSIS — R21 Rash and other nonspecific skin eruption: Secondary | ICD-10-CM | POA: Insufficient documentation

## 2013-04-23 HISTORY — DX: Dermatitis, unspecified: L30.9

## 2013-04-23 MED ORDER — DIPHENHYDRAMINE HCL 25 MG PO TABS: 25.0000 mg | ORAL_TABLET | ORAL | Status: DC | PRN

## 2013-04-23 NOTE — ED Provider Notes (Signed)
CSN: [id number]     Arrival date & time 04/23/13  2017 History  This chart was scribed for Audree CamelScott T Henleigh Robello, MD by Jarvis Morganaylor Ferguson, ED Scribe. This patient was seen in room PTR4C/PTR4C and the patient's care was started at 8:49 PM    Chief Complaint  Patient presents with  . Rash   The history is provided by the patient. No language interpreter was used.    HPI Comments: Monica Mclaughlin is a 23 y.o. female who presents to the Emergency Department complaining of generalized, itchy rash onset 1 week ago. She states that the rash is localized on her back, neck, left leg and left foot. Patient believes that she may have gotten this rash from work. 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 She has not taken any antihistamines for the rash. Patient denies any dyspnea.   Past Medical History  Diagnosis Date  . Abnormal Pap smear 2011  . Candidiasis   . Sickle cell trait   . H/O dysmenorrhea   . Monilial vulvovaginitis 08/2005  . 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 Dysplasia of cervix, low grade (CIN 1) 2008  . Vulvovaginitis   . ASCUS (atypical squamous cells of undetermined significance) on Pap smear   . Dysplasia of cervix, low grade (CIN 1)   . Eczema    Past Surgical History  Procedure Laterality Date  . Wisdom tooth extraction     Family History  Problem Relation Age of Onset  . Hypertension Mother   . Asthma Mother   . Alcohol abuse Father   . Hypertension Maternal Aunt   . Hypertension Maternal Grandmother   . Diabetes Maternal Grandfather   . Cancer Maternal Grandfather    History  Substance Use Topics  . Smoking status: Current Every Day Smoker    Types: Cigarettes  . Smokeless tobacco: Never Used     Comment: pack a week  . Alcohol Use: No   OB History   Grav Para Term Preterm Abortions TAB SAB Ect Mult Living   1 1 1  0 0 0 0 0 0 1     Review of Systems  Constitutional:  Negative for fever.  HENT: Negative for trouble swallowing.   Respiratory: Negative for shortness of breath and wheezing.   Skin: Positive for rash. Negative for wound.  All other systems reviewed and are negative.     Allergies  Shellfish allergy  Home Medications   Prior to Admission medications   Medication Sig Start Date End Date Taking? Authorizing Provider  cephALEXin (KEFLEX) 500 MG capsule Take 1 capsule (500 mg total) by mouth 4 (four) times daily. 04/27/12   Cathlyn ParsonsAngela M Kabbe, NP  cyclobenzaprine (FLEXERIL) 10 MG tablet Take 1 tablet (10 mg total) by mouth 2 (two) times daily as needed for muscle spasms. 04/04/12   Jimmie MollyPaolo Coll, MD  Etonogestrel [facility](NEXPLANON [place]) Inject into the skin.    Historical Provider, MD  ibuprofen (ADVIL,MOTRIN) 400 MG tablet Take 1 tablet (400 mg total) by mouth every 6 (six) hours as needed for pain. 04/04/12   Jimmie MollyPaolo Coll, MD  metroNIDAZOLE (FLAGYL) 500 MG tablet Take 1 tablet (500 mg total) by mouth 2 (two) times daily. 04/27/12   Cathlyn ParsonsAngela M Kabbe, NP  metroNIDAZOLE (FLAGYL) 500 MG tablet Take all 4 pills at one time. 01/18/13   Reuben Likesavid C Keller, MD  metroNIDAZOLE (FLAGYL) 500 MG tablet Take 1  tablet (500 mg total) by mouth 2 (two) times daily. 03/18/13   Linna HoffJames D Kindl, MD  metroNIDAZOLE (METROGEL VAGINAL) 0.75 % vaginal gel Place 1 Applicatorful vaginally at bedtime. For 5 nights 03/18/13   Linna HoffJames D Kindl, MD  metroNIDAZOLE (METROGEL) 0.75 % vaginal gel Place 1 Applicatorful vaginally 2 (two) times daily. 01/14/13   Reuben Likesavid C Keller, MD  Prenatal Vit-Fe Fumarate-FA (PRENATAL COMPLETE) 14-0.4 MG TABS Take 1 tablet by mouth 1 day or 1 dose. 01/13/11   Arman FilterGail K Schulz, NP  tobramycin (TOBREX) 0.3 % ophthalmic solution Place 1 drop into both eyes every 4 (four) hours. 08/17/12   Reuben Likesavid C Keller, MD   Triage Vitals: BP 119/86  Pulse 92  Temp(Src) 98.1 F (36.7 C) (Oral)  Resp 20  Wt 125 lb (56.7 kg)  SpO2 100%  LMP 04/16/2013  Physical Exam  Nursing note and vitals  reviewed. Constitutional: She is oriented to person, place, and time. She appears well-developed and well-nourished. No distress.  HENT:  Head: Normocephalic and atraumatic.  Right Ear: External ear normal.  Left Ear: External ear normal.  Eyes: Right eye exhibits no discharge. Left eye exhibits no discharge.  Neck: Neck supple. No tracheal deviation present.  Cardiovascular: Normal rate.   Pulmonary/Chest: Effort normal and breath sounds normal. No stridor. No respiratory distress. She has no wheezes.  Abdominal: She exhibits no distension.  Neurological: She is alert and oriented to person, place, and time.  Skin: Skin is warm, dry and intact. Rash (small (<1 cm), maculopapular lesions intermittently on arms, left foot and lower back. No swelling, absess or cellulitis) noted.  Psychiatric: She has a normal mood and affect. Her behavior is normal.     ED Course  Procedures (including critical care time)   DIAGNOSTIC STUDIES: Oxygen Saturation is 100% on RA, normal by my interpretation.    COORDINATION OF CARE: 8:59 PM- Will discharge patient and order prescription Benadryl and advise patient to take every 4 hours PRN.  Pt advised of plan for treatment and pt agrees.      Labs Review` Labs Reviewed - No data to display  Imaging Review No results found.   EKG Interpretation None      MDM   Final diagnoses:  Rash and nonspecific skin eruption    Patient's rash is consistent with insect bites or allergens. Will treat with benadryl po prn itching. No superinfection. No other symptoms. Could be bedbugs but unlikely given that this has been going on for a week and daughter who co-sleeps has no rash or bites. Stable for discharge.  I personally performed the services described in this documentation, which was scribed in my presence. The recorded information has been reviewed and is accurate.   Audree CamelScott T Hiep Ollis, MD 04/23/13 2322

## 2013-04-23 NOTE — ED Notes (Signed)
Pt states she has hive like bumps over her body.  These red areas on on her back, neck, left leg and foot. The rash does itch, no beadryl taken. Mom thinks she may have gotten it from work.

## 2013-11-06 ENCOUNTER — Encounter (HOSPITAL_COMMUNITY): Payer: Self-pay | Admitting: Emergency Medicine

## 2014-05-19 ENCOUNTER — Emergency Department (HOSPITAL_COMMUNITY)
Admission: EM | Admit: 2014-05-19 | Discharge: 2014-05-19 | Disposition: A | Discharge status: Home / Self Care | Payer: 59 | Source: Home / Self Care | Attending: Emergency Medicine | Admitting: Emergency Medicine

## 2014-05-19 ENCOUNTER — Encounter (HOSPITAL_COMMUNITY): Payer: Self-pay | Admitting: Emergency Medicine

## 2014-05-19 DIAGNOSIS — Z711 Person with feared health complaint in whom no diagnosis is made: Secondary | ICD-10-CM

## 2014-05-19 NOTE — ED Provider Notes (Signed)
CSN: [id number]     Arrival date & time 05/19/14  1103 History   First MD Initiated Contact with Patient 05/19/14 1213     Chief Complaint  Patient presents with  . Rash   (Consider location/radiation/quality/duration/timing/severity/associated sxs/prior Treatment) HPI Comments: Patient states that she recently has both of her nipples pierced but after 48 hours she decided to remove the piercings. Has come to clinic to see if healing process is proceeding as it should be. Denies any pain, redness, fever, chill, discharge at sites. States they will only occasionally itch.   Patient is a 24 y.o. female presenting with rash. The history is provided by the patient.  Rash   Past Medical History  Diagnosis Date  . Abnormal Pap smear 2011  . Candidiasis   . Sickle cell trait   . H/O dysmenorrhea   . Monilial vulvovaginitis 08/2005  . 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 Dysplasia of cervix, low grade (CIN 1) 2008  . Vulvovaginitis   . ASCUS (atypical squamous cells of undetermined significance) on Pap smear   . Dysplasia of cervix, low grade (CIN 1)   . Eczema    Past Surgical History  Procedure Laterality Date  . Wisdom tooth extraction     Family History  Problem Relation Age of Onset  . Hypertension Mother   . Asthma Mother   . Alcohol abuse Father   . Hypertension Maternal Aunt   . Hypertension Maternal Grandmother   . Diabetes Maternal Grandfather   . Cancer Maternal Grandfather    History  Substance Use Topics  . Smoking status: Current Every Day Smoker    Types: Cigarettes  . Smokeless tobacco: Never Used     Comment: pack a week  . Alcohol Use: No   OB History    Gravida Para Term Preterm AB TAB SAB Ectopic Multiple Living   1 1 1  0 0 0 0 0 0 1     Review of Systems  Skin: Positive for rash.  All other systems reviewed and are negative.   Allergies  Shellfish allergy  Home Medications   Prior to Admission medications   Medication Sig Start Date  End Date Taking? Authorizing Provider  cephALEXin (KEFLEX) 500 MG capsule Take 1 capsule (500 mg total) by mouth 4 (four) times daily. 04/27/12   Cathlyn ParsonsAngela M Kabbe, NP  cyclobenzaprine (FLEXERIL) 10 MG tablet Take 1 tablet (10 mg total) by mouth 2 (two) times daily as needed for muscle spasms. 04/04/12   Jimmie MollyPaolo Coll, MD  diphenhydrAMINE (BENADRYL) 25 MG tablet Take 1-2 tablets (25-50 mg total) by mouth every 4 (four) hours as needed for itching. 04/23/13   Pricilla LovelessScott Goldston, MD  Etonogestrel [facility](NEXPLANON [place]) Inject into the skin.    Historical Provider, MD  ibuprofen (ADVIL,MOTRIN) 400 MG tablet Take 1 tablet (400 mg total) by mouth every 6 (six) hours as needed for pain. 04/04/12   Jimmie MollyPaolo Coll, MD  metroNIDAZOLE (FLAGYL) 500 MG tablet Take 1 tablet (500 mg total) by mouth 2 (two) times daily. 04/27/12   Cathlyn ParsonsAngela M Kabbe, NP  metroNIDAZOLE (FLAGYL) 500 MG tablet Take all 4 pills at one time. 01/18/13   Reuben Likesavid C Keller, MD  metroNIDAZOLE (FLAGYL) 500 MG tablet Take 1 tablet (500 mg total) by mouth 2 (two) times daily. 03/18/13   Linna HoffJames D Kindl, MD  metroNIDAZOLE (METROGEL VAGINAL) 0.75 % vaginal gel Place 1 Applicatorful vaginally at bedtime. For 5 nights 03/18/13   Linna HoffJames D Kindl, MD  metroNIDAZOLE (METROGEL) 0.75 % vaginal gel Place 1 Applicatorful vaginally 2 (two) times daily. 01/14/13   Reuben Likesavid C Keller, MD  Prenatal Vit-Fe Fumarate-FA (PRENATAL COMPLETE) 14-0.4 MG TABS Take 1 tablet by mouth 1 day or 1 dose. 01/13/11   Earley FavorGail Schulz, NP  tobramycin (TOBREX) 0.3 % ophthalmic solution Place 1 drop into both eyes every 4 (four) hours. 08/17/12   Reuben Likesavid C Keller, MD   BP 145/80 mmHg  Pulse 73  Temp(Src) 98.2 F (36.8 C) (Oral)  Resp 16  SpO2 100% Physical Exam  Constitutional: She is oriented to person, place, and time. She appears well-developed and well-nourished. No distress.  HENT:  Head: Normocephalic and atraumatic.  Cardiovascular: Normal rate.   Pulmonary/Chest: Effort normal.  Neurological: She is alert and  oriented to person, place, and time.  Skin: Skin is warm and dry.  Piercing sites at both breasts completely healed and nearly nonvisible  Psychiatric: She has a normal mood and affect. Her behavior is normal.  Nursing note and vitals reviewed.   ED Course  Procedures (including critical care time) Labs Review Labs Reviewed - No data to display  Imaging Review No results found.   MDM   1. Physically well but worried   Follow up prn    Ria ClockJennifer Lee H Hephzibah Strehle, [place] 05/19/14 1437

## 2014-05-19 NOTE — ED Notes (Signed)
Reports she noticed rash on both breasts that lasted for a few days Noticed it appeared after she had both of her nipples pierced on 4/22; removed piercing on 4/22 Also reports pain on left thumb and attributes pain to the piercing Denies fevers, chills Alert, no signs of acute distress.

## 2014-10-09 ENCOUNTER — Ambulatory Visit (INDEPENDENT_AMBULATORY_CARE_PROVIDER_SITE_OTHER): Payer: 59 | Admitting: Internal Medicine

## 2014-10-09 ENCOUNTER — Encounter: Payer: Self-pay | Admitting: Internal Medicine

## 2014-10-09 ENCOUNTER — Other Ambulatory Visit (INDEPENDENT_AMBULATORY_CARE_PROVIDER_SITE_OTHER): Payer: 59

## 2014-10-09 VITALS — BP 108/60 | HR 66 | Temp 98.7°F | Resp 12 | Ht 61.0 in | Wt 136.1 lb

## 2014-10-09 DIAGNOSIS — F17200 Nicotine dependence, unspecified, uncomplicated: Secondary | ICD-10-CM

## 2014-10-09 DIAGNOSIS — K649 Unspecified hemorrhoids: Secondary | ICD-10-CM | POA: Diagnosis not present

## 2014-10-09 DIAGNOSIS — R5383 Other fatigue: Secondary | ICD-10-CM | POA: Diagnosis not present

## 2014-10-09 DIAGNOSIS — Z720 Tobacco use: Secondary | ICD-10-CM

## 2014-10-09 LAB — COMPREHENSIVE METABOLIC PANEL
ALT: 9 U/L (ref 0–35)
AST: 13 U/L (ref 0–37)
Albumin: 4.2 g/dL (ref 3.5–5.2)
Alkaline Phosphatase: 50 U/L (ref 39–117)
BILIRUBIN TOTAL: 0.4 mg/dL (ref 0.2–1.2)
BUN: 9 mg/dL (ref 6–23)
CALCIUM: 9.2 mg/dL (ref 8.4–10.5)
CO2: 26 mEq/L (ref 19–32)
Chloride: 106 mEq/L (ref 96–112)
Creatinine, Ser: 0.91 mg/dL (ref 0.40–1.20)
GFR: 95.97 mL/min (ref >60.00)
Glucose, Bld: 87 mg/dL (ref 70–99)
Potassium: 4.1 mEq/L (ref 3.5–5.1)
Sodium: 137 mEq/L (ref 135–145)
Total Protein: 7.1 g/dL (ref 6.0–8.3)

## 2014-10-09 LAB — LIPID PANEL
CHOL/HDL RATIO: 3
CHOLESTEROL: 147 mg/dL (ref 0–200)
HDL: 58 mg/dL (ref >39.00)
LDL Cholesterol: 74 mg/dL (ref 0–99)
NonHDL: 88.99
TRIGLYCERIDES: 73 mg/dL (ref 0.0–149.0)
VLDL: 14.6 mg/dL (ref 0.0–40.0)

## 2014-10-09 LAB — CBC
HCT: 41.2 % (ref 36.0–46.0)
HEMOGLOBIN: 13.5 g/dL (ref 12.0–15.0)
MCHC: 32.7 g/dL (ref 30.0–36.0)
MCV: 83 fl (ref 78.0–100.0)
PLATELETS: 246 10*3/uL (ref 150.0–400.0)
RBC: 4.97 Mil/uL (ref 3.87–5.11)
RDW: 14.5 % (ref 11.5–15.5)
WBC: 10.7 10*3/uL — ABNORMAL HIGH (ref 4.0–10.5)

## 2014-10-09 LAB — HEMOGLOBIN A1C: Hgb A1c MFr Bld: 5.2 % (ref 4.6–6.5)

## 2014-10-09 LAB — TSH: TSH: 1.13 u[IU]/mL (ref 0.35–4.50)

## 2014-10-09 MED ORDER — HYDROCORTISONE 2.5 % RE CREA: 1.0000 "application " | TOPICAL_CREAM | Freq: Two times a day (BID) | RECTAL | Status: DC

## 2014-10-09 NOTE — Progress Notes (Signed)
Pre visit review using our clinic review tool, if applicable. No additional management support is needed unless otherwise documented below in the visit note. 

## 2014-10-09 NOTE — Patient Instructions (Signed)
You can use claritin or muccinex for the congestion to help get rid of it.   We have sent in hydrocortisone cream that you can use twice a day for 2 weeks on the hemorrhoids. If they go away sooner you can stop using the cream and if they come back you can use it again. The best way to keep those from coming back is to keep from straining by eating enough fiber and water.   We are checking the labs today and will call you back with the results.   High-Fiber Diet Fiber is found in fruits, vegetables, and grains. A high-fiber diet encourages the addition of more whole grains, legumes, fruits, and vegetables in your diet. The recommended amount of fiber for adult males is 38 g per day. For adult females, it is 25 g per day. Pregnant and lactating women should get 28 g of fiber per day. If you have a digestive or bowel problem, ask your caregiver for advice before adding high-fiber foods to your diet. Eat a variety of high-fiber foods instead of only a select few type of foods. Be sure to drink about 4-6 glasses of water daily to help the digestion.  PURPOSE  To increase stool bulk.  To make bowel movements more regular to prevent constipation.  To lower cholesterol.  To prevent overeating. WHEN IS THIS DIET USED?  It may be used if you have constipation and hemorrhoids.  It may be used if you have uncomplicated diverticulosis (intestine condition) and irritable bowel syndrome.  It may be used if you need help with weight management.  It may be used if you want to add it to your diet as a protective measure against atherosclerosis, diabetes, and cancer. SOURCES OF FIBER  Whole-grain breads and cereals.  Fruits, such as apples, oranges, bananas, berries, prunes, and pears.  Vegetables, such as green peas, carrots, sweet potatoes, beets, broccoli, cabbage, spinach, and artichokes.  Legumes, such split peas, soy, lentils.  Almonds. FIBER CONTENT IN FOODS Starches and Grains / Dietary  Fiber (g)  Cheerios, 1 cup / 3 g  Corn Flakes cereal, 1 cup / 0.7 g  Rice crispy treat cereal, 1 cup / 0.3 g  Instant oatmeal (cooked),  cup / 2 g  Frosted wheat cereal, 1 cup / 5.1 g  Brown, long-grain rice (cooked), 1 cup / 3.5 g  White, long-grain rice (cooked), 1 cup / 0.6 g  Enriched macaroni (cooked), 1 cup / 2.5 g Legumes / Dietary Fiber (g)  Baked beans (canned, plain, or vegetarian),  cup / 5.2 g  Kidney beans (canned),  cup / 6.8 g  Pinto beans (cooked),  cup / 5.5 g Breads and Crackers / Dietary Fiber (g)  Plain or honey graham crackers, 2 squares / 0.7 g  Saltine crackers, 3 squares / 0.3 g  Plain, salted pretzels, 10 pieces / 1.8 g  Whole-wheat bread, 1 slice / 1.9 g  White bread, 1 slice / 0.7 g  Raisin bread, 1 slice / 1.2 g  Plain bagel, 3 oz / 2 g  Flour tortilla, 1 oz / 0.9 g  Corn tortilla, 1 small / 1.5 g  Hamburger or hotdog bun, 1 small / 0.9 g Fruits / Dietary Fiber (g)  Apple with skin, 1 medium / 4.4 g  Sweetened applesauce,  cup / 1.5 g  Banana,  medium / 1.5 g  Grapes, 10 grapes / 0.4 g  Orange, 1 small / 2.3 g  Raisin, 1.5 oz /  1.6 g  Melon, 1 cup / 1.4 g Vegetables / Dietary Fiber (g)  Green beans (canned),  cup / 1.3 g  Carrots (cooked),  cup / 2.3 g  Broccoli (cooked),  cup / 2.8 g  Peas (cooked),  cup / 4.4 g  Mashed potatoes,  cup / 1.6 g  Lettuce, 1 cup / 0.5 g  Corn (canned),  cup / 1.6 g  Tomato,  cup / 1.1 g Document Released: 12/22/2004 Document Revised: 06/23/2011 Document Reviewed: 03/26/2011 ExitCare Patient Information 2015 [employer]. This information is not intended to replace advice given to you by your health care provider. Make sure you discuss any questions you have with your health care provider.

## 2014-10-10 ENCOUNTER — Encounter: Payer: Self-pay | Admitting: Internal Medicine

## 2014-10-10 DIAGNOSIS — K649 Unspecified hemorrhoids: Secondary | ICD-10-CM | POA: Insufficient documentation

## 2014-10-10 NOTE — Assessment & Plan Note (Signed)
She is a social smoker and knows that she needs to quit and will think about it. She is not sure that she wants to do that right now. Talked to her about the health consequences from smoking.

## 2014-10-10 NOTE — Assessment & Plan Note (Signed)
Rx for anusol cream. Advised that constipation and straining make these worse. Talked to her about adequate fiber and water intake.

## 2014-10-10 NOTE — Progress Notes (Signed)
   Subjective:    Patient ID: Monica Mclaughlin, female    DOB: [birth year], 24 y.o.   MRN: [id number]  HPI The patient is a 24 YO female coming in for hemorrhoids. Since her pregnancy she has had hemorrhoid that sometimes cause her pain. She has rare spots of blood on toilet paper when hemorrhoids are bad. No blood in her stool. No weight change. Denies constipation but does strain significantly most of the time. Not able to eat good fiber and does not drink much water daily.   PMH, [facility], social history reviewed and updated.   Review of Systems  Constitutional: Positive for fatigue. Negative for fever, activity change, appetite change and unexpected weight change.  HENT: Negative.   Eyes: Negative.   Respiratory: Negative for cough, chest tightness, shortness of breath and wheezing.   Cardiovascular: Negative for chest pain, palpitations and leg swelling.  Gastrointestinal: Positive for constipation. Negative for vomiting, abdominal pain, diarrhea, blood in stool and abdominal distention.       Hemorrhoids  Musculoskeletal: Negative.   Skin: Negative.   Neurological: Negative.   Psychiatric/Behavioral: Negative.      Objective:   Physical Exam  Constitutional: She is oriented to person, place, and time. She appears well-developed and well-nourished.  HENT:  Head: Normocephalic and atraumatic.  Eyes: EOM are normal.  Neck: Normal range of motion.  Cardiovascular: Normal rate and regular rhythm.   Pulmonary/Chest: Effort normal and breath sounds normal. No respiratory distress. She has no wheezes. She has no rales.  Abdominal: Soft. Bowel sounds are normal. She exhibits no distension. There is no tenderness. There is no rebound.  Genitourinary:  Rectal exam deferred by patient  Musculoskeletal: She exhibits no edema.  Neurological: She is alert and oriented to person, place, and time. Coordination normal.  Skin: Skin is warm and dry.  Psychiatric: She has a normal mood and affect.    Filed Vitals:   10/09/14 1554  BP: 108/60  Pulse: 66  Temp: 98.7 F (37.1 C)  TempSrc: Oral  Resp: 12  Height:  (1.549 m)  Weight: 136 lb 1.9 oz (61.744 kg)  SpO2: 96%      Assessment & Plan:

## 2015-02-10 ENCOUNTER — Encounter (HOSPITAL_COMMUNITY): Payer: Self-pay | Admitting: *Deleted

## 2015-02-10 ENCOUNTER — Emergency Department (INDEPENDENT_AMBULATORY_CARE_PROVIDER_SITE_OTHER)
Admission: EM | Admit: 2015-02-10 | Discharge: 2015-02-10 | Disposition: A | Discharge status: Home / Self Care | Payer: 59 | Source: Home / Self Care | Attending: Family Medicine | Admitting: Family Medicine

## 2015-02-10 DIAGNOSIS — S50862A Insect bite (nonvenomous) of left forearm, initial encounter: Secondary | ICD-10-CM | POA: Diagnosis not present

## 2015-02-10 MED ORDER — FLUTICASONE PROPIONATE 0.05 % EX CREA
TOPICAL_CREAM | Freq: Two times a day (BID) | CUTANEOUS | Status: DC
Start: 2015-02-10 — End: 2019-07-03

## 2015-02-10 NOTE — ED Provider Notes (Signed)
CSN: [id number]     Arrival date & time 02/10/15  1330 History   First MD Initiated Contact with Patient 02/10/15 1450     Chief Complaint  Patient presents with  . Insect Bite   (Consider location/radiation/quality/duration/timing/severity/associated sxs/prior Treatment) Patient is a 25 y.o. female presenting with rash. The history is provided by the patient and a parent.  Rash Location:  Shoulder/arm Shoulder/arm rash location:  L forearm Quality: blistering, itchiness, redness and swelling   Severity:  Mild Onset quality:  Sudden Duration:  2 days Progression:  Unchanged Chronicity:  New Context: insect bite/sting   Relieved by:  None tried Worsened by:  Nothing tried Ineffective treatments:  None tried Associated symptoms: no fever     Past Medical History  Diagnosis Date  . Abnormal Pap smear 2011  . Candidiasis   . Sickle cell trait (HCC)   . H/O dysmenorrhea   . Monilial vulvovaginitis 08/2005  . 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 Dysplasia of cervix, low grade (CIN 1) 2008  . Vulvovaginitis   . ASCUS (atypical squamous cells of undetermined significance) on Pap smear   . Dysplasia of cervix, low grade (CIN 1)   . Eczema    Past Surgical History  Procedure Laterality Date  . Wisdom tooth extraction     Family History  Problem Relation Age of Onset  . Hypertension Mother   . Asthma Mother   . Alcohol abuse Father   . Hypertension Maternal Aunt   . Hypertension Maternal Grandmother   . Diabetes Maternal Grandfather   . Cancer Maternal Grandfather    Social History  Substance Use Topics  . Smoking status: Current Every Day Smoker    Types: Cigarettes  . Smokeless tobacco: Never Used     Comment: pack a week  . Alcohol Use: No   OB History    Gravida Para Term Preterm AB TAB SAB Ectopic Multiple Living   0 0 0 0 0 0 1     Review of Systems  Constitutional: Negative for fever.  Musculoskeletal: Negative.   Skin: Positive for rash.   All other systems reviewed and are negative.   Allergies  Shellfish allergy  Home Medications   Prior to Admission medications   Medication Sig Start Date End Date Taking? Authorizing Provider  Etonogestrel (NEXPLANON [place]) Inject into the skin.   Yes Historical Provider, MD  hydrocortisone (ANUSOL-HC) 2.5 % rectal cream Place 1 application rectally 2 (two) times daily. 10/09/14   Myrlene Broker, MD   Meds Ordered and Administered this Visit  Medications - No data to display  BP 109/69 mmHg  Pulse 60  Temp(Src) 98.2 F (36.8 C) (Oral)  Resp 18  SpO2 100%  LMP 01/26/2015 (Exact Date) No data found.   Physical Exam  Constitutional: She is oriented to person, place, and time. She appears well-developed and well-nourished. No distress.  Musculoskeletal: She exhibits tenderness.  Erythematous blistered local rash to volar aspect of left forearm, no infection.  Neurological: She is alert and oriented to person, place, and time.  Skin: Skin is warm and dry. There is erythema.  Nursing note and vitals reviewed.   ED Course  Procedures (including critical care time)  Labs Review Labs Reviewed - No data to display  Imaging Review No results found.   Visual Acuity Review  Right Eye Distance:   Left Eye Distance:   Bilateral Distance:    Right Eye Near:   Left  Eye Near:    Bilateral Near:         MDM  No diagnosis found.  Meds ordered this encounter  Medications  . fluticasone (CUTIVATE) 0.05 % cream    Sig: Apply topically 2 (two) times daily.    Dispense:  30 g    Refill:  0      Linna Hoff, MD 02/10/15 [phone]

## 2015-02-10 NOTE — ED Notes (Signed)
States noticed sudden sensation of something biting her left wrist & forearm 2 days ago while her coat was on.  Has since noticed appearance of 3 possible bite marks with warmth, redness, swelling to anterior wrist and distal forearm.

## 2015-05-14 ENCOUNTER — Encounter: Payer: Self-pay | Admitting: Internal Medicine

## 2015-05-14 ENCOUNTER — Ambulatory Visit (INDEPENDENT_AMBULATORY_CARE_PROVIDER_SITE_OTHER): Payer: 59 | Admitting: Internal Medicine

## 2015-05-14 ENCOUNTER — Ambulatory Visit (INDEPENDENT_AMBULATORY_CARE_PROVIDER_SITE_OTHER)
Admission: RE | Admit: 2015-05-14 | Discharge: 2015-05-14 | Disposition: A | Discharge status: Home / Self Care | Payer: 59 | Source: Ambulatory Visit | Attending: Internal Medicine | Admitting: Internal Medicine

## 2015-05-14 VITALS — BP 124/78 | HR 66 | Temp 98.3°F | Resp 20 | Wt 150.0 lb

## 2015-05-14 DIAGNOSIS — R079 Chest pain, unspecified: Secondary | ICD-10-CM | POA: Insufficient documentation

## 2015-05-14 DIAGNOSIS — Z720 Tobacco use: Secondary | ICD-10-CM

## 2015-05-14 DIAGNOSIS — K219 Gastro-esophageal reflux disease without esophagitis: Secondary | ICD-10-CM | POA: Insufficient documentation

## 2015-05-14 DIAGNOSIS — F17200 Nicotine dependence, unspecified, uncomplicated: Secondary | ICD-10-CM

## 2015-05-14 MED ORDER — PANTOPRAZOLE SODIUM 40 MG PO TBEC: 40.0000 mg | DELAYED_RELEASE_TABLET | Freq: Every day | ORAL | Status: DC

## 2015-05-14 NOTE — Assessment & Plan Note (Signed)
Mild at worst, not clear is related to current pain, ok for protonix trial

## 2015-05-14 NOTE — Assessment & Plan Note (Signed)
Etiology unclear, ecg reviewed  - no acute changes such as pericarditis, and pain is o/w pleuritic although may have some element of reflux? Doubt PE or dissection,   Cant also r/o neuritic or other MSK; recent labs reviewed wth pt,  today for trial protonix 40 qd, cxr and f/u with PCP

## 2015-05-14 NOTE — Assessment & Plan Note (Signed)
Urged to quit, offered chantix but declines at this time

## 2015-05-14 NOTE — Patient Instructions (Signed)
Your EKG was OK today  Please take all new medication as prescribed - the protonix for possible reflux  Please continue all other medications as before, and refills have been done if requested.  Please have the pharmacy call with any other refills you may need.  Please keep your appointments with your specialists as you may have planned  Please go to the XRAY Department in the Basement (go straight as you get off the elevator) for the x-ray testing  You will be contacted by phone if any changes need to be made immediately.  Otherwise, you will receive a letter about your results with an explanation, but please check with MyChart first.  Please remember to sign up for MyChart if you have not done so, as this will be important to you in the future with finding out test results, communicating by private email, and scheduling acute appointments online when needed.  Please return to see Dr Okey Duprerawford if not improved in 1 week

## 2015-05-14 NOTE — Progress Notes (Signed)
Subjective:    Patient ID: Monica Mclaughlin, female    DOB: 01/27/1990, 25 y.o.   MRN: [id number]  HPI  Pt here with acute, with hx of tobacco and BCP use, Here with 1.5 wks onset left sided chest pressure lasting up to 30 min at a time, mild to mod, radates at times toward the left scapula, not assoc with diaphoresis, n/v, palp or dizziness, though has had also some mild pressure to lower sternal area but helped with belching.  Pain today is pleuritic but non exertional, non positional and not assoc with left arm movement or chest palpation.  No St, cough, fever.  No hx of same prior. Pt is concerned as a family member just had valve surgury for rheumatic valvular dz, and she is aware of risk of smoking, but cannot quit due to stress. Past Medical History  Diagnosis Date  . Abnormal Pap smear 2011  . Candidiasis   . Sickle cell trait (HCC)   . H/O dysmenorrhea   . Monilial vulvovaginitis 08/2005  . 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 Dysplasia of cervix, low grade (CIN 1) 2008  . Vulvovaginitis   . ASCUS (atypical squamous cells of undetermined significance) on Pap smear   . Dysplasia of cervix, low grade (CIN 1)   . Eczema    Past Surgical History  Procedure Laterality Date  . Wisdom tooth extraction      reports that she has been smoking Cigarettes.  She has never used smokeless tobacco. She reports that she does not drink alcohol or use illicit drugs. family history includes Alcohol abuse in her father; Asthma in her mother; Cancer in her maternal grandfather; Diabetes in her maternal grandfather; Hypertension in her maternal aunt, maternal grandmother, and mother. Allergies  Allergen Reactions  . Shellfish Allergy Swelling    Swelling of throat   Current Outpatient Prescriptions on File Prior to Visit  Medication Sig Dispense Refill  . Etonogestrel (NEXPLANON [place]) Inject into the skin.    . fluticasone (CUTIVATE) 0.05 % cream Apply topically 2 (two) times daily. 30 g 0    . hydrocortisone (ANUSOL-HC) 2.5 % rectal cream Place 1 application rectally 2 (two) times daily. 30 g 0   No current facility-administered medications on file prior to visit.   Review of Systems  Constitutional: Negative for unusual diaphoresis or night sweats HENT: Negative for ear swelling or discharge Eyes: Negative for worsening visual haziness  Respiratory: Negative for choking and stridor.   Gastrointestinal: Negative for distension or worsening eructation Genitourinary: Negative for retention or change in urine volume.  Musculoskeletal: Negative for other MSK pain or swelling Skin: Negative for color change and worsening wound Neurological: Negative for tremors and numbness other than noted  Psychiatric/Behavioral: Negative for decreased concentration or agitation other than above       Objective:   Physical Exam BP 124/78 mmHg  Pulse 66  Temp(Src) 98.3 F (36.8 C) (Oral)  Resp 20  Wt 150 lb (68.04 kg)  SpO2 98% VS noted, not ill appearing Constitutional: Pt appears in no apparent distress HENT: Head: NCAT.  Right Ear: External ear normal.  Left Ear: External ear normal.  Eyes: . Pupils are equal, round, and reactive to light. Conjunctivae and EOM are normal Neck: Normal range of motion. Neck supple.  Cardiovascular: Normal rate and regular rhythm.   Pulmonary/Chest: Effort normal and breath sounds without rales or wheezing.  No Specific chest wall tenderness Abd:  Soft, NT, ND, +  BS Neurological: Pt is alert. Not confused , motor grossly intact Skin: Skin is warm. No rash, no LE edema Psychiatric: Pt behavior is normal. No agitation.   ECG: NSR with PAC, no acute changes  Lab Results  Component Value Date   WBC 10.7* 10/09/2014   HGB 13.5 10/09/2014   HCT 41.2 10/09/2014   PLT 246.0 10/09/2014   GLUCOSE 87 10/09/2014   CHOL 147 10/09/2014   TRIG 73.0 10/09/2014   HDL 58.00 10/09/2014   LDLCALC 74 10/09/2014   ALT 9 10/09/2014   AST 13 10/09/2014   NA  137 10/09/2014   K 4.1 10/09/2014   CL 106 10/09/2014   CREATININE 0.91 10/09/2014   BUN 9 10/09/2014   CO2 26 10/09/2014   TSH 1.13 10/09/2014   HGBA1C 5.2 10/09/2014       Assessment & Plan:

## 2015-05-14 NOTE — Progress Notes (Signed)
Pre visit review using our clinic review tool, if applicable. No additional management support is needed unless otherwise documented below in the visit note. 

## 2015-05-27 ENCOUNTER — Telehealth: Payer: Self-pay | Admitting: Internal Medicine

## 2015-05-27 NOTE — Telephone Encounter (Signed)
Is requesting results of chest x ray

## 2015-05-27 NOTE — Telephone Encounter (Signed)
Patient aware of results.

## 2015-10-31 LAB — HM PAP SMEAR

## 2015-12-03 ENCOUNTER — Other Ambulatory Visit: Payer: Self-pay | Admitting: Obstetrics and Gynecology

## 2016-01-21 MED FILL — metroNIDAZOLE 0.75 % GEL: 0.75 | 5 days supply | Qty: 70 | Fill #0

## 2016-11-03 DIAGNOSIS — N870 Mild cervical dysplasia: Secondary | ICD-10-CM | POA: Diagnosis not present

## 2016-11-03 DIAGNOSIS — Z01419 Encounter for gynecological examination (general) (routine) without abnormal findings: Secondary | ICD-10-CM | POA: Diagnosis not present

## 2016-11-03 LAB — HM PAP SMEAR

## 2017-04-01 IMAGING — DX DG CHEST 2V
2 series · 2 of 2 positions shown · non-contrast
Comparison: None.

CLINICAL DATA: Left upper chest pain for 1 week, smoking history

EXAM:
CHEST  2 VIEW

[chest pa]
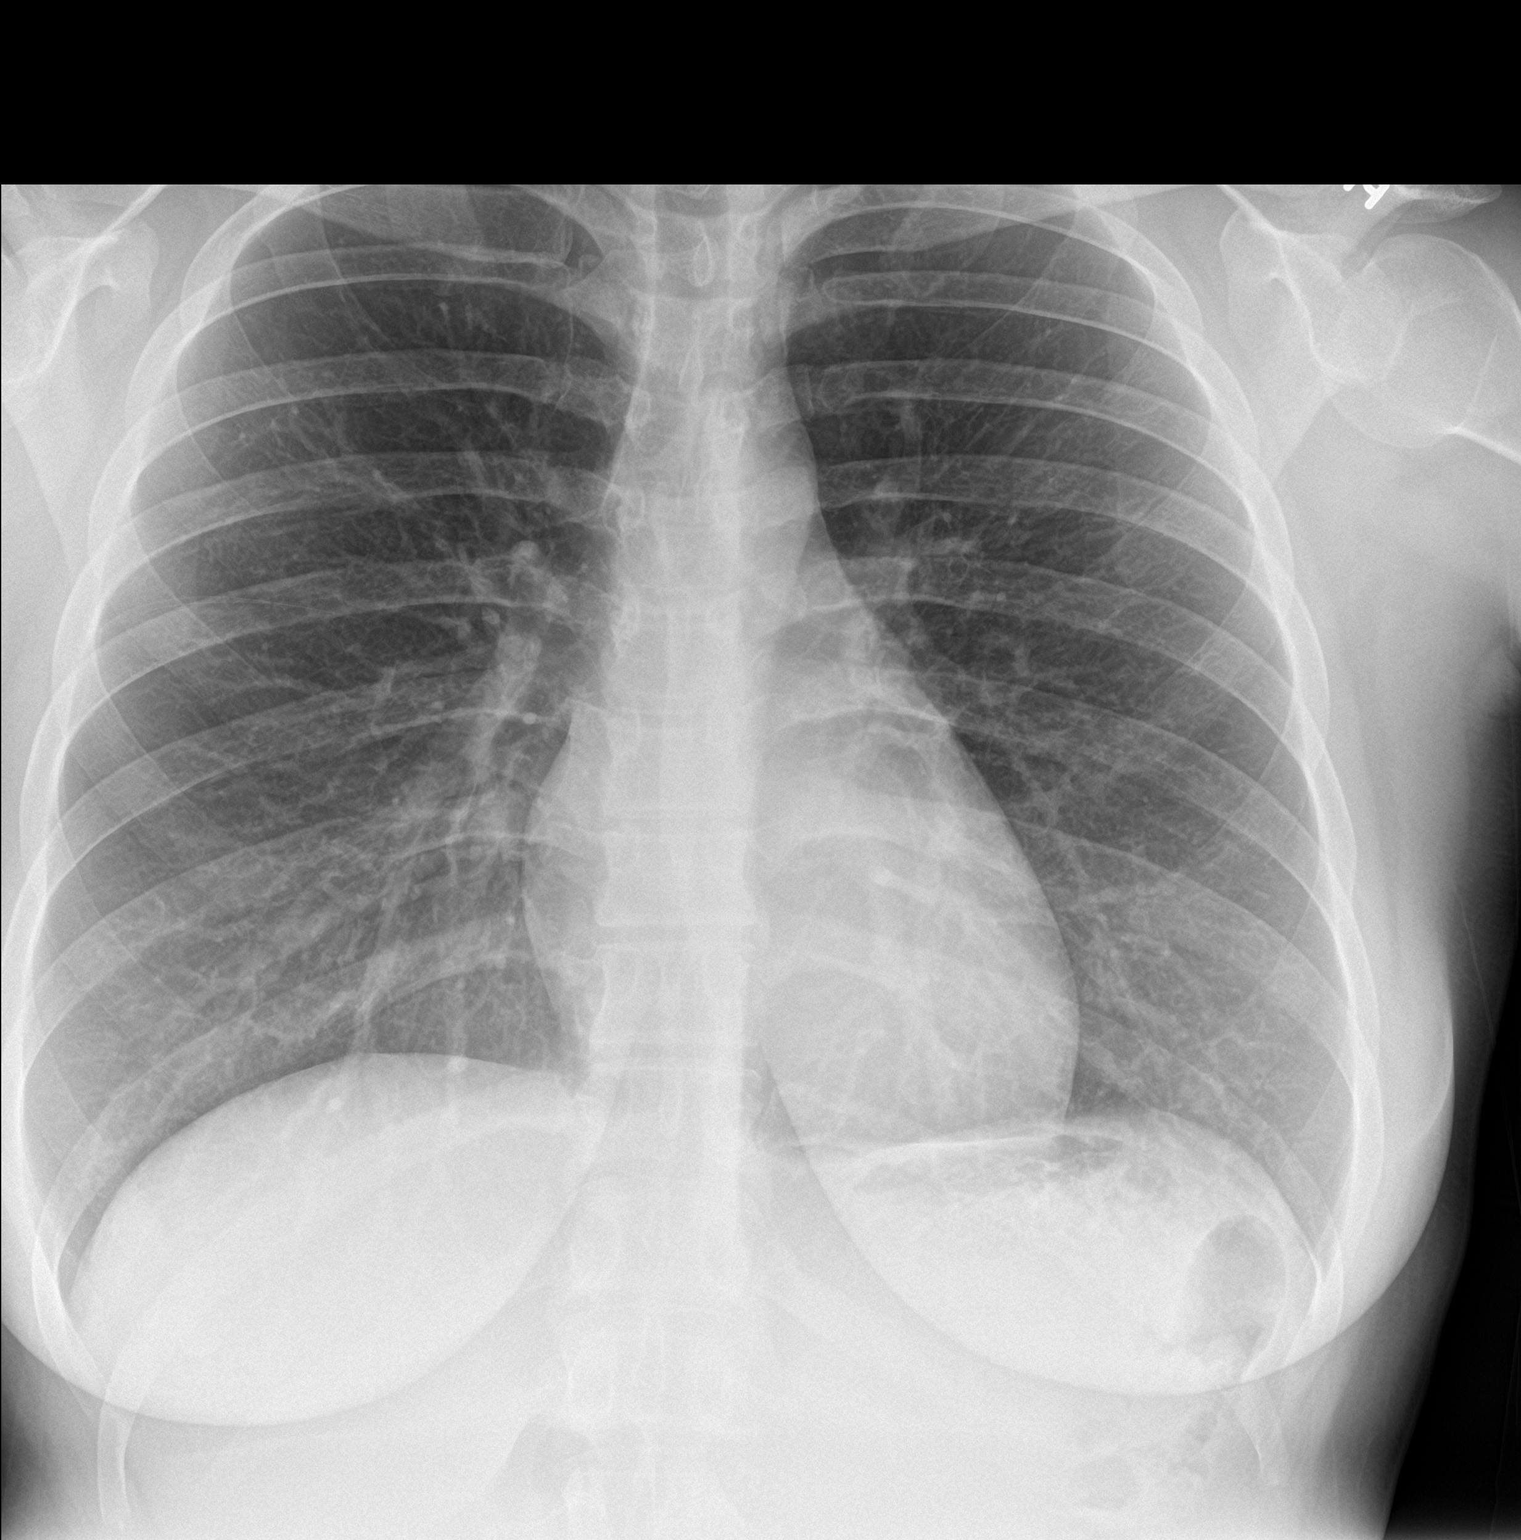

[chest lat]
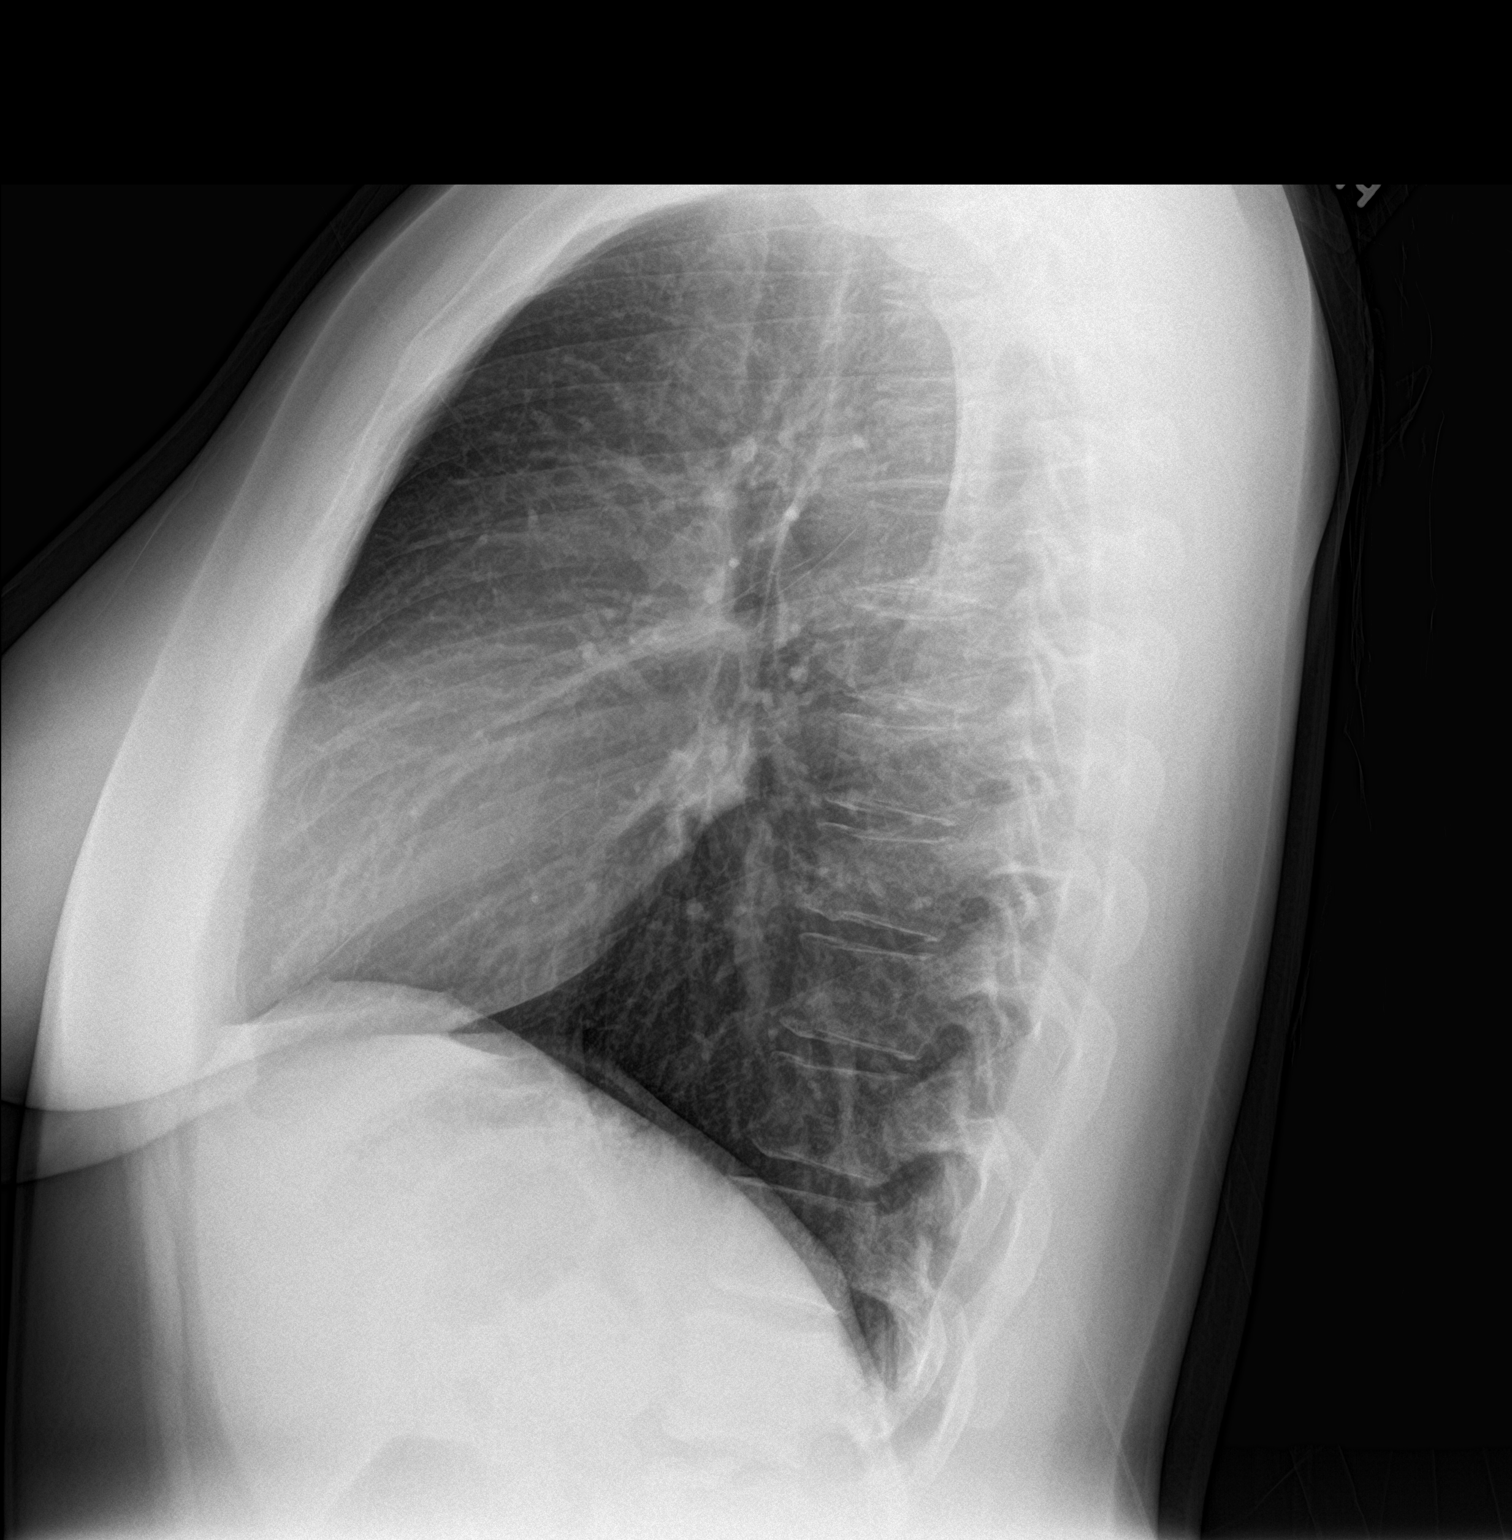

[2 of 2 positions shown; findings below may reference images not displayed]

FINDINGS: No active infiltrate or effusion is seen. Mediastinal and hilar
contours are unremarkable. The heart is within normal limits in
size. No bony abnormality is seen.
IMPRESSION: No active cardiopulmonary disease.

## 2017-06-01 DIAGNOSIS — N939 Abnormal uterine and vaginal bleeding, unspecified: Secondary | ICD-10-CM | POA: Diagnosis not present

## 2017-07-12 ENCOUNTER — Encounter: Payer: Self-pay | Admitting: Internal Medicine

## 2017-07-12 ENCOUNTER — Encounter (HOSPITAL_COMMUNITY): Payer: Self-pay | Admitting: Obstetrics and Gynecology

## 2017-07-12 DIAGNOSIS — N939 Abnormal uterine and vaginal bleeding, unspecified: Secondary | ICD-10-CM | POA: Diagnosis not present

## 2017-07-12 DIAGNOSIS — N898 Other specified noninflammatory disorders of vagina: Secondary | ICD-10-CM | POA: Diagnosis not present

## 2017-07-12 DIAGNOSIS — R102 Pelvic and perineal pain: Secondary | ICD-10-CM | POA: Diagnosis not present

## 2017-07-12 MED FILL — metroNIDAZOLE 500 MG TABS: 500 | 7 days supply | Qty: 14 | Fill #0

## 2017-07-12 NOTE — Progress Notes (Signed)
Abstracted and sent to scan  

## 2017-08-17 MED FILL — metroNIDAZOLE 500 MG TABS: 500 | 7 days supply | Qty: 14 | Fill #0

## 2018-01-10 ENCOUNTER — Encounter: Payer: Self-pay | Admitting: Internal Medicine

## 2018-01-10 ENCOUNTER — Ambulatory Visit: Payer: 59 | Admitting: Internal Medicine

## 2018-01-10 VITALS — BP 110/80 | HR 84 | Temp 98.3°F | Ht 61.0 in | Wt 155.0 lb

## 2018-01-10 DIAGNOSIS — F17200 Nicotine dependence, unspecified, uncomplicated: Secondary | ICD-10-CM | POA: Diagnosis not present

## 2018-01-10 DIAGNOSIS — L308 Other specified dermatitis: Secondary | ICD-10-CM

## 2018-01-10 DIAGNOSIS — R221 Localized swelling, mass and lump, neck: Secondary | ICD-10-CM

## 2018-01-10 DIAGNOSIS — L309 Dermatitis, unspecified: Secondary | ICD-10-CM | POA: Insufficient documentation

## 2018-01-10 MED ORDER — BETAMETHASONE VALERATE 0.1 % EX OINT: 1.0000 "application " | TOPICAL_OINTMENT | Freq: Two times a day (BID) | CUTANEOUS | 1 refills | Status: DC

## 2018-01-10 MED ORDER — METHYLPREDNISOLONE ACETATE 40 MG/ML IJ SUSP
40.0000 mg | Freq: Once | INTRAMUSCULAR | Status: AC
  Administered 2018-01-10: 40 mg via INTRAMUSCULAR

## 2018-01-10 MED FILL — BETAMETHASONE 0.1% OINTMENT: 0.1 | 10 days supply | Qty: 45 | Fill #0

## 2018-01-10 NOTE — Assessment & Plan Note (Signed)
Time spent counseling about tobacco usage: 3 minutes. I have asked about smoking and is smoking more than usual. The patient is advised to quit. The patient is willing to quit. They would like to try to quit in the next 6 months. We will follow up with them in 6 months.   

## 2018-01-10 NOTE — Progress Notes (Signed)
   Subjective:   Patient ID: Monica Mclaughlin, female    DOB: [birth year], 28 y.o.   MRN: [id number]  HPI The patient is a 28 YO female coming in for several concerns including lump under chin (noticed about 2-3 days ago, mild cold/sinus symptoms, not taking anything for this, denies pain in the area, denies fevers or chills, some nose drainage and sinus pressure), and eczema (has had all life, worse in the last 2 months, has not tried anything for it, on neck and hands and legs, very itchy and spreading as she is scratching due to itch) and smoking (she is worried about the lump on her neck more due to smoking, she is smoking about 1/2 ppd but admits to not smoking most of the cigarette, wants to quit, not sure when is the right time).   Review of Systems  Constitutional: Negative.   HENT: Negative.   Eyes: Negative.   Respiratory: Negative for cough, chest tightness and shortness of breath.   Cardiovascular: Negative for chest pain, palpitations and leg swelling.  Gastrointestinal: Negative for abdominal distention, abdominal pain, constipation, diarrhea, nausea and vomiting.  Musculoskeletal: Negative.   Skin: Positive for rash.       Lump in neck  Neurological: Negative.   Psychiatric/Behavioral: Negative.     Objective:  Physical Exam Constitutional:      Appearance: She is well-developed.  HENT:     Head: Normocephalic and atraumatic.  Neck:     Musculoskeletal: Normal range of motion.     Comments: Small mobile CAD left neck Cardiovascular:     Rate and Rhythm: Normal rate and regular rhythm.  Pulmonary:     Effort: Pulmonary effort is normal. No respiratory distress.     Breath sounds: Normal breath sounds. No wheezing or rales.  Abdominal:     General: Bowel sounds are normal. There is no distension.     Palpations: Abdomen is soft.     Tenderness: There is no abdominal tenderness. There is no rebound.  Lymphadenopathy:     Cervical: Cervical adenopathy present.   Skin:    General: Skin is warm and dry.     Comments: Moderate to severe eczema on the posterior neck, hands, thighs bilateral legs without skin breakdown but severe stigmata of scratching.   Neurological:     Mental Status: She is alert and oriented to person, place, and time.     Coordination: Coordination normal.     Vitals:   01/10/18 1356  BP: 110/80  Pulse: 84  Temp: 98.3 F (36.8 C)  TempSrc: Oral  SpO2: 99%  Weight: 155 lb (70.3 kg)  Height: 5\' 1"  (1.549 m)    Assessment & Plan:  Depo-medrol 40 mg IM given at visit

## 2018-01-10 NOTE — Assessment & Plan Note (Signed)
Depo-medrol 40 mg IM given at visit. Rx for betamethasone cream to use on the eczema and advised not to scratch. Benadryl at night time for itching. Leukewarm showers.

## 2018-01-10 NOTE — Patient Instructions (Signed)
Try zyrtec (cetirizine) over the counter for the drainage.   We have given you a shot for the rash to help it get better and a cream to use twice a day on the rash.

## 2018-01-11 DIAGNOSIS — R221 Localized swelling, mass and lump, neck: Secondary | ICD-10-CM | POA: Insufficient documentation

## 2018-01-11 NOTE — Assessment & Plan Note (Signed)
Suspect lymph node due to sinus drainage. Will monitor and if growing will need [place] due to cigarette smoker.

## 2018-03-18 MED FILL — BETAMETHASONE 0.1% OINTMENT: 0.1 | 10 days supply | Qty: 45 | Fill #1

## 2018-03-30 DIAGNOSIS — Z01419 Encounter for gynecological examination (general) (routine) without abnormal findings: Secondary | ICD-10-CM | POA: Diagnosis not present

## 2018-03-30 DIAGNOSIS — Z6829 Body mass index (BMI) 29.0-29.9, adult: Secondary | ICD-10-CM | POA: Diagnosis not present

## 2018-04-25 ENCOUNTER — Ambulatory Visit (INDEPENDENT_AMBULATORY_CARE_PROVIDER_SITE_OTHER): Payer: 59 | Admitting: Internal Medicine

## 2018-04-25 ENCOUNTER — Telehealth: Payer: Self-pay

## 2018-04-25 ENCOUNTER — Encounter: Payer: Self-pay | Admitting: Internal Medicine

## 2018-04-25 ENCOUNTER — Ambulatory Visit: Payer: Self-pay | Admitting: *Deleted

## 2018-04-25 DIAGNOSIS — R220 Localized swelling, mass and lump, head: Secondary | ICD-10-CM | POA: Diagnosis not present

## 2018-04-25 DIAGNOSIS — J029 Acute pharyngitis, unspecified: Secondary | ICD-10-CM | POA: Diagnosis not present

## 2018-04-25 MED ORDER — AZITHROMYCIN 250 MG PO TABS: ORAL_TABLET | ORAL | 0 refills | Status: DC

## 2018-04-25 MED FILL — AZITHROMYCIN 250 MG TABLET: 250 | 5 days supply | Qty: 6 | Fill #0

## 2018-04-25 NOTE — Telephone Encounter (Signed)
Patient contacted by carson/scheduling, we have scheduled virtual visit with dr crawford at 10am today

## 2018-04-25 NOTE — Assessment & Plan Note (Signed)
Could be related to doxycyline or ceftin. For now would avoid both. If necessary can re-trial either as this does not represent anaphylaxis reaction. Advised to take zyrtec BID and benadryl as needed. Avoid scratching. Call for worsening. Seek care immediately for mouth or throat swelling or SOB.

## 2018-04-25 NOTE — Telephone Encounter (Signed)
Patient is having possible allergic reaction- she took her sister's antibiotic. Patient has a sore  throat and she has some white patches in the back- she still has white patches. Pain in glands.  Patient states she took- I dose Claritin and the Ceftin and other unknown medication. Patient reports she woke up with swelling bottom R chin, warm to touch and red- itching.She has been taking Benadryl steadily since Saturday- which has not helped a whole lot.Patient reports has swelling in her neck- she has swelling  has L eye. Swelling has gone down some and she does not have problems swallowing.  Patient does have fine red rash - right side of chest, R arm and hand. Areas were itching- not itching- not itching anymore- but still present and red.Patient is leaving work to find out what the other medication is- she will know when office calls her back.  Reason for Disposition . Swelling began after taking a drug    Patient took medication that belonged to her sister she started having a reaction the next morning- she has been taking Benadryl- but it is not calming the reaction and she is continuing to have symptoms.  Answer Assessment - Initial Assessment Questions 1. ONSET: "When did the swelling start?" (e.g., minutes, hours, days)     Saturday 2. LOCATION: "What part of the face is swollen?"     R chin and L eye 3. SEVERITY: "How swollen is it?"     Chin is still swollen- but has improved- left eye is swollen 4. ITCHING: "Is there any itching?" If so, ask: "How much?"   (Scale 1-10; mild, moderate or severe)     Patient states the itching is better 5. PAIN: "Is the swelling painful to touch?" If so, ask: "How painful is it?"   (Scale 1-10; mild, moderate or severe)     Tender to touch 6. FEVER: "Do you have a fever?" If so, ask: "What is it, how was it measured, and when did it start?"      No fever 7. CAUSE: "What do you think is causing the face swelling?"     Possible allergic reaction 8.  RECURRENT SYMPTOM: "Have you had face swelling before?" If so, ask: "When was the last time?" "What happened that time?"     No to medication- she has had reaction to shrimp 9. OTHER SYMPTOMS: "Do you have any other symptoms?" (e.g., toothache, leg swelling)     no 10. PREGNANCY: "Is there any chance you are pregnant?" "When was your last menstrual period?"       No- LMP- 03/10/18  Protocols used: [facility]

## 2018-04-25 NOTE — Telephone Encounter (Signed)
Virtual visit has been made for today 4/20.

## 2018-04-25 NOTE — Assessment & Plan Note (Signed)
Concerning for strep with white plaque on tonsils visible in video visit, no cough. Rx for azithromycin given reaction allergic reaction to (ceftin or doxycycline).

## 2018-04-25 NOTE — Telephone Encounter (Signed)
-----   Message from Myrlene Broker, MD sent at 04/25/2018  9:09 AM EDT -----   ----- Message ----- From: Alonna Minium, RN Sent: 04/25/2018   9:07 AM EDT To: Myrlene Broker, MD

## 2018-04-25 NOTE — Telephone Encounter (Signed)
Needs virtual visit, recommend today

## 2018-04-25 NOTE — Progress Notes (Signed)
Virtual Visit via Video Note  I connected with Monica Mclaughlin on 04/25/18 at 10:00 AM EDT by a video enabled telemedicine application and verified that I am speaking with the correct person using two identifiers.   I discussed the limitations of evaluation and management by telemedicine and the availability of in person appointments. The patient expressed understanding and agreed to proceed.  History of Present Illness: The patient is a 28 y.o. female with visit for several problems including sore throat (started about 1 week ago, denies fevers, some swelling in her neck and tender, saw some white patches on the tonsils and used q tips and saw some yellowish drainage which was foul smelling, denies cough or SOB, denies headaches or sinus symptoms, took 1 dose doxycycline and ceftin which was her sister's thinking this would help, got allergic reaction) and possible allergic reaction (took 2 medications which were her sisters ceftin 300 mg and doxycycline 100 mg, some chin swelling, started Friday and some itching and rash, no throat swelling or SOB, took benadryl about 4 times total over the weekend, overall it is improving gradually, some rash on her arms and chest, mild itching, overall not worsening),   Observations/Objective: Appearance: mildly ill, breathing appears normal, casual grooming, white lesion on the left tonsil, left eyelid swollen, right chin and neck with mild swelling, rash on the right forearm and right chest wall, abdomen does not appear distended, throat red and white lesion on left tonsil, no oropharynx swelling nor lip or tongue swelling, mental status is A and O times 3  Assessment and Plan: See problem oriented charting  Follow Up Instructions: rx for azithromycin and zyrtec BID for allergic reaction, no scratching, advice given for if worsening.   I discussed the assessment and treatment plan with the patient. The patient was provided an opportunity to ask questions and all  were answered. The patient agreed with the plan and demonstrated an understanding of the instructions.   The patient was advised to call back or seek an in-person evaluation if the symptoms worsen or if the condition fails to improve as anticipated.  Myrlene Broker, MD   Visit time 25 minutes: greater than 50% of that time was spent in face to face counseling and coordination of care with the patient: counseled about allergic reactions, not to take others medications, sore throat symptoms, coronavirus symptoms to monitor for, treatment options

## 2018-04-25 NOTE — Telephone Encounter (Signed)
Routing to dr crawford---patient woke up Friday morning with sore throat and took some of her sisters left over antibiotics---one of which was Ceftin and another one that started with a "D", but she's not sure medicine name (she can leave work and go home to get bottle if she needs to)--she only took one dose of each medicine---she woke up Saturday morning with right jaw/chin swelling, left eye swelling and itching--no tongue swelling--no rash---she immediately started benadryl and has continued taking each day---she's had appx 4 doses of benadryl total---no breathing problems, and only improvement since on benadryl has been chin swelling a little improved, afebrile the entire time but still has sore throat, and can see white patchy areas where tonsil area is, --patient does not want to go to urgent care due to copay cost she would have to pay---please advise if we can we do virtual visit or what recommendations do you have for patient?  I will call patient back on her mobile cell phone, thanks

## 2018-05-13 DIAGNOSIS — Z3046 Encounter for surveillance of implantable subdermal contraceptive: Secondary | ICD-10-CM | POA: Diagnosis not present

## 2018-05-19 ENCOUNTER — Encounter: Payer: Self-pay | Admitting: Internal Medicine

## 2018-05-19 DIAGNOSIS — Z3040 Encounter for surveillance of contraceptives, unspecified: Secondary | ICD-10-CM | POA: Diagnosis not present

## 2018-05-25 DIAGNOSIS — Z3046 Encounter for surveillance of implantable subdermal contraceptive: Secondary | ICD-10-CM | POA: Diagnosis not present

## 2018-08-15 MED FILL — TRIAMCINOLONE 0.1% CREAM: 0.1 | 20 days supply | Qty: 80 | Fill #0

## 2019-05-01 ENCOUNTER — Other Ambulatory Visit: Payer: Self-pay | Admitting: Obstetrics and Gynecology

## 2019-07-03 ENCOUNTER — Ambulatory Visit: Payer: 59 | Admitting: Internal Medicine

## 2019-07-03 ENCOUNTER — Other Ambulatory Visit: Payer: Self-pay | Admitting: Internal Medicine

## 2019-07-03 ENCOUNTER — Other Ambulatory Visit: Payer: Self-pay

## 2019-07-03 ENCOUNTER — Encounter: Payer: Self-pay | Admitting: Internal Medicine

## 2019-07-03 VITALS — BP 116/78 | HR 58 | Temp 98.9°F | Ht 61.0 in | Wt 164.0 lb

## 2019-07-03 DIAGNOSIS — R221 Localized swelling, mass and lump, neck: Secondary | ICD-10-CM

## 2019-07-03 DIAGNOSIS — L308 Other specified dermatitis: Secondary | ICD-10-CM

## 2019-07-03 DIAGNOSIS — K649 Unspecified hemorrhoids: Secondary | ICD-10-CM

## 2019-07-03 DIAGNOSIS — J358 Other chronic diseases of tonsils and adenoids: Secondary | ICD-10-CM | POA: Diagnosis not present

## 2019-07-03 DIAGNOSIS — F17200 Nicotine dependence, unspecified, uncomplicated: Secondary | ICD-10-CM

## 2019-07-03 MED ORDER — BETAMETHASONE VALERATE 0.1 % EX OINT: 1.0000 "application " | TOPICAL_OINTMENT | Freq: Two times a day (BID) | CUTANEOUS | 1 refills | Status: DC

## 2019-07-03 MED ORDER — HYDROCORTISONE ACETATE 25 MG RE SUPP: 25.0000 mg | Freq: Two times a day (BID) | RECTAL | 0 refills | Status: DC

## 2019-07-03 MED FILL — BETAMETHASONE VALER 0.1% OI: 0.1 | 22 days supply | Qty: 45 | Fill #0

## 2019-07-03 MED FILL — HYDROCORTISONE AC 25 MG SUP: 25 | 6 days supply | Qty: 12 | Fill #0

## 2019-07-03 NOTE — Progress Notes (Signed)
Subjective:   Patient ID: Monica Mclaughlin, female    DOB: Aug 28, 1990, 29 y.o.   MRN: [id number]  HPI The patient is a 29 YO female coming in for problems with tonsils/throat. She is a current 1 PPD smoker. She has had problems with tonsils for at least 1 year. She was treated for strep last year and this did not resolve her problems. She has a small lump on the front of her neck. She also has tonsil stones which she is getting out fairly often with q-tips. She has a white spot on the left tonsil which has been present for many months. She denies current sore throat or tonsil pain. She denies fevers or chills. She denies significant allergy problems. Lump on neck has not changed sizes. She is also wanting to get her eczema evaluated (she has it on her neck, used some creams on it thinking this would help and it did not, went to beach several times thinking the salty air would help but it dried out her skin further, itching, no bumps or skin breakdown, on the hands and neck mostly), and hemorrhoids (since pregnancy has a large size hemorrhoid, has tried the anusol cream which is not shrinking it, she denies current pain or bleeding, does have constipation often and sometimes straining).   Review of Systems  Constitutional: Negative.   HENT: Negative.        Tonsil problems, lump in neck  Eyes: Negative.   Respiratory: Negative for cough, chest tightness and shortness of breath.   Cardiovascular: Negative for chest pain, palpitations and leg swelling.  Gastrointestinal: Negative for abdominal distention, abdominal pain, constipation, diarrhea, nausea and vomiting.       Hemorrhoid  Musculoskeletal: Negative.   Skin: Positive for color change.  Neurological: Negative.   Psychiatric/Behavioral: Negative.     Objective:  Physical Exam Constitutional:      Appearance: She is well-developed.  HENT:     Head: Normocephalic and atraumatic.     Mouth/Throat:     Comments: Tonsils large  bilaterally, some redness oropharynx but no signs for strep throat today, left superior tonsil there is a white dot which is likely a calcified tonsil stone Neck:     Comments: Small LAD mobile left neck which is stable in size from prior 2020 Cardiovascular:     Rate and Rhythm: Normal rate and regular rhythm.  Pulmonary:     Effort: Pulmonary effort is normal. No respiratory distress.     Breath sounds: Normal breath sounds. No wheezing or rales.  Abdominal:     General: Bowel sounds are normal. There is no distension.     Palpations: Abdomen is soft.     Tenderness: There is no abdominal tenderness. There is no rebound.  Musculoskeletal:     Cervical back: Normal range of motion.  Skin:    General: Skin is warm and dry.     Findings: Rash present.     Comments: Rash consistent with eczema on the neck and hands  Neurological:     Mental Status: She is alert and oriented to person, place, and time.     Coordination: Coordination normal.     Vitals:   07/03/19 1612  BP: 116/78  Pulse: (!) 58  Temp: 98.9 F (37.2 C)  TempSrc: Oral  SpO2: 98%  Weight: 164 lb (74.4 kg)  Height: 5\' 1"  (1.549 m)    This visit occurred during the SARS-CoV-2 public health emergency.  Safety protocols were in  place, including screening questions prior to the visit, additional usage of staff PPE, and extensive cleaning of exam room while observing appropriate contact time as indicated for disinfecting solutions.   Assessment & Plan:

## 2019-07-03 NOTE — Patient Instructions (Signed)
We will get you in with the ear nose and throat specialist.    

## 2019-07-04 ENCOUNTER — Encounter: Payer: Self-pay | Admitting: Internal Medicine

## 2019-07-04 DIAGNOSIS — J358 Other chronic diseases of tonsils and adenoids: Secondary | ICD-10-CM | POA: Insufficient documentation

## 2019-07-04 NOTE — Assessment & Plan Note (Signed)
Sent in hydrocortisone suppository to see if this helps better and if not could refer to GI for banding.

## 2019-07-04 NOTE — Assessment & Plan Note (Signed)
Advised of risk/harm and asked to quit but she feels unable at this time.

## 2019-07-04 NOTE — Assessment & Plan Note (Signed)
Referral to ENT for evaluation although this has not changed or grown in the past year.

## 2019-07-04 NOTE — Assessment & Plan Note (Signed)
Refill betamethasone to use on the neck and hands short term. Then use good moisturizing routine.

## 2019-07-04 NOTE — Assessment & Plan Note (Signed)
Refer to ENT to discuss options for her tonsils. She does not have signs of strep throat and does not require antibiotics today.

## 2019-07-07 ENCOUNTER — Encounter: Payer: Self-pay | Admitting: Internal Medicine

## 2019-07-07 ENCOUNTER — Ambulatory Visit: Payer: 59 | Admitting: Internal Medicine

## 2019-07-07 ENCOUNTER — Other Ambulatory Visit: Payer: Self-pay

## 2019-07-07 ENCOUNTER — Ambulatory Visit (INDEPENDENT_AMBULATORY_CARE_PROVIDER_SITE_OTHER): Payer: 59

## 2019-07-07 VITALS — BP 124/76 | HR 67 | Temp 98.5°F | Ht 61.0 in | Wt 166.0 lb

## 2019-07-07 DIAGNOSIS — M5441 Lumbago with sciatica, right side: Secondary | ICD-10-CM | POA: Insufficient documentation

## 2019-07-07 DIAGNOSIS — M5442 Lumbago with sciatica, left side: Secondary | ICD-10-CM

## 2019-07-07 MED ORDER — CYCLOBENZAPRINE HCL 5 MG PO TABS
5.0000 mg | ORAL_TABLET | Freq: Three times a day (TID) | ORAL | 1 refills | Status: DC | PRN
Start: 2019-07-07 — End: 2021-12-22

## 2019-07-07 MED ORDER — KETOROLAC TROMETHAMINE 30 MG/ML IJ SOLN
30.0000 mg | Freq: Once | INTRAMUSCULAR | Status: AC
  Administered 2019-07-07: 30 mg via INTRAMUSCULAR

## 2019-07-07 MED ORDER — METHYLPREDNISOLONE ACETATE 40 MG/ML IJ SUSP
40.0000 mg | Freq: Once | INTRAMUSCULAR | Status: AC
Start: 2019-07-07 — End: 2019-07-07
  Administered 2019-07-07: 40 mg via INTRAMUSCULAR

## 2019-07-07 MED FILL — CYCLOBENZAPRINE HCL 5 MG TA: 5 | 10 days supply | Qty: 30 | Fill #0

## 2019-07-07 NOTE — Assessment & Plan Note (Signed)
Given depo-medrol 40 mg IM and toradol 30 mg IM at visit. Rx flexeril and checking x-ray given length of symptoms and radiation of pain.

## 2019-07-07 NOTE — Progress Notes (Signed)
   Subjective:   Patient ID: Monica Mclaughlin, female    DOB: November 20, 1990, 29 y.o.   MRN: [id number]  HPI The patient is a 29 YO female coming in for pain in the low back. Started about 2 weeks ago. Overall was improving but then she started exercising after last visit here on Monday due to her weight being higher. She then started walking and exercising Mon, Tuesday, Wednesday and the pain in her low back went to another level. She started taking aleve to help with pain but it did not help much. Pain is across low back and down both legs. Denies numbness or weakness in legs. Denies change in bowels or bladder.   Review of Systems  Constitutional: Negative.   HENT: Negative.   Eyes: Negative.   Respiratory: Negative for cough, chest tightness and shortness of breath.   Cardiovascular: Negative for chest pain, palpitations and leg swelling.  Gastrointestinal: Negative for abdominal distention, abdominal pain, constipation, diarrhea, nausea and vomiting.  Musculoskeletal: Positive for back pain and myalgias.  Skin: Negative.   Neurological: Negative.   Psychiatric/Behavioral: Negative.     Objective:  Physical Exam Constitutional:      Appearance: She is well-developed.  HENT:     Head: Normocephalic and atraumatic.  Cardiovascular:     Rate and Rhythm: Normal rate and regular rhythm.  Pulmonary:     Effort: Pulmonary effort is normal. No respiratory distress.     Breath sounds: Normal breath sounds. No wheezing or rales.  Abdominal:     General: Bowel sounds are normal. There is no distension.     Palpations: Abdomen is soft.     Tenderness: There is no abdominal tenderness. There is no rebound.  Musculoskeletal:        General: Tenderness present.     Cervical back: Normal range of motion.     Comments: Pain lumbar midline and paraspinal bilateral  Skin:    General: Skin is warm and dry.  Neurological:     Mental Status: She is alert and oriented to person, place, and time.      Coordination: Coordination normal.     Vitals:   07/07/19 1023  BP: 124/76  Pulse: 67  Temp: 98.5 F (36.9 C)  TempSrc: Oral  SpO2: 97%  Weight: 166 lb (75.3 kg)  Height: 5\' 1"  (1.549 m)    This visit occurred during the SARS-CoV-2 public health emergency.  Safety protocols were in place, including screening questions prior to the visit, additional usage of staff PPE, and extensive cleaning of exam room while observing appropriate contact time as indicated for disinfecting solutions.   Assessment & Plan:  Depo-medrol 40 mg IM and Toradol 30 mg IM given at visit

## 2019-07-07 NOTE — Patient Instructions (Signed)
We have given you the shot today and this should help today and then be >50% better in 2-3 days.  We have sent in the muscle relaxer flexeril to use up to 2-3 times a day for pain.  We have done the x-ray today and will call you back about the results.

## 2019-07-25 ENCOUNTER — Encounter (INDEPENDENT_AMBULATORY_CARE_PROVIDER_SITE_OTHER): Payer: Self-pay | Admitting: Otolaryngology

## 2019-07-25 ENCOUNTER — Other Ambulatory Visit: Payer: Self-pay

## 2019-07-25 ENCOUNTER — Ambulatory Visit (INDEPENDENT_AMBULATORY_CARE_PROVIDER_SITE_OTHER): Payer: 59 | Admitting: Otolaryngology

## 2019-07-25 VITALS — Temp 96.6°F

## 2019-07-25 DIAGNOSIS — J343 Hypertrophy of nasal turbinates: Secondary | ICD-10-CM

## 2019-07-25 DIAGNOSIS — J358 Other chronic diseases of tonsils and adenoids: Secondary | ICD-10-CM | POA: Diagnosis not present

## 2019-07-25 NOTE — Progress Notes (Signed)
HPI: Monica Mclaughlin is a 29 y.o. female who presents is referred by her PCP for evaluation of chronic tonsil complaints.  She states that she has frequent tonsil stones and that her tonsil stays swollen.  She also notices a small polyp in the throat is located on the left tonsil.  She also feels like she has swollen lymph nodes and points to the area of the submental region and larynx. She has intermittent nasal congestion and states that she snores.  She has occasional sore throat but frequently gets tonsil stones.. Patient does smoke about a pack a day.  Past Medical History:  Diagnosis Date  . Abnormal Pap smear 2011  . ASCUS (atypical squamous cells of undetermined significance) on Pap smear   . 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 Candidiasis   . Dysplasia of cervix, low grade (CIN 1) 2008  . Dysplasia of cervix, low grade (CIN 1)   . Eczema   . H/O dysmenorrhea   . Monilial vulvovaginitis 08/2005  . Sickle cell trait (HCC)   . Vulvovaginitis    Past Surgical History:  Procedure Laterality Date  . WISDOM TOOTH EXTRACTION     Social History   Socioeconomic History  . Marital status: Single    Spouse name: Not on file  . Number of children: Not on file  . Years of education: Not on file  . Highest education level: Not on file  Occupational History  . Not on file  Tobacco Use  . Smoking status: Current Every Day Smoker    Packs/day: 1.00    Years: 15.00    Pack years: 15.00    Types: Cigarettes    Start date: 2006  . Smokeless tobacco: Never Used  Substance and Sexual Activity  . Alcohol use: No  . Drug use: No  . Sexual activity: Not on file    Comment: nexplanon   Other Topics Concern  . Not on file  Social History Narrative  . Not on file   Social Determinants of Health   Financial Resource Strain:   . Difficulty of Paying Living Expenses:   Food Insecurity:   . Worried About Programme researcher, broadcasting/film/video in the Last Year:   . Barista in the Last  Year:   Transportation Needs:   . Freight forwarder (Medical):   Marland Kitchen Lack of Transportation (Non-Medical):   Physical Activity:   . Days of Exercise per Week:   . Minutes of Exercise per Session:   Stress:   . Feeling of Stress :   Social Connections:   . Frequency of Communication with Friends and Family:   . Frequency of Social Gatherings with Friends and Family:   . Attends Religious Services:   . Active Member of Clubs or Organizations:   . Attends Banker Meetings:   Marland Kitchen Marital Status:    Family History  Problem Relation Age of Onset  . Hypertension Mother   . Asthma Mother   . Alcohol abuse Father   . Hypertension Maternal Aunt   . Hypertension Maternal Grandmother   . Diabetes Maternal Grandfather   . Cancer Maternal Grandfather    Allergies  Allergen Reactions  . Shellfish Allergy Swelling    Swelling of throat   Prior to Admission medications   Medication Sig Start Date End Date Taking? Authorizing Provider  betamethasone valerate ointment (VALISONE) 0.1 % Apply 1 application topically 2 (two) times daily. 07/03/19  Yes Hillard Danker  A, MD  cyclobenzaprine (FLEXERIL) 5 MG tablet Take 1 tablet (5 mg total) by mouth 3 (three) times daily as needed for muscle spasms. 07/07/19  Yes Myrlene Broker, MD  Etonogestrel [facility]) Inject into the skin.   Yes [provider]  hydrocortisone (ANUSOL-HC) 25 MG suppository Place 1 suppository (25 mg total) rectally 2 (two) times daily. 07/03/19  Yes Myrlene Broker, MD     Positive ROS: Otherwise negative  All other systems have been reviewed and were otherwise negative with the exception of those mentioned in the HPI and as above.  Physical Exam: Constitutional: Alert, well-appearing, no acute distress Ears: External ears without lesions or tenderness. Ear canals are clear bilaterally with intact, clear TMs.  Nasal: External nose without lesions. Septum with minimal deformity and  mild rhinitis.  She does have large inferior turbinates.  There are no polyps.  Both middle meatus regions are clear..  Oral: Lips and gums without lesions. Tongue and palate mucosa without lesions. Posterior oropharynx clear.  Tonsils are average size and appearance.  Of note she does have a small cyst on the left tonsil. Neck: No palpable adenopathy or masses.  There is no significant palpable adenopathy.  The area she points to represents a benign small 5 to 6 mm lymph node. Respiratory: Breathing comfortably Lungs clear to auscultation. Cardiac exam: Regular rate and rhythm without murmur. Skin: No facial/neck lesions or rash noted.  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  She apparently has had chronic problems with her tonsils and would like to have them removed.  I reviewed the morbidity associated with tonsillectomy with her but she would still like to have the tonsils removed and briefly discussed tonsillectomy with her.  At the same time we will plan on performing turbinate reductions as she has nasal congestion which is worse at night as well as snoring. We will schedule this at her convenience.   Narda Bonds, MD   CC:

## 2019-12-28 MED FILL — BETAMETHASONE VALER 0.1% OI: 0.1 | 22 days supply | Qty: 45 | Fill #1

## 2020-02-23 MED FILL — BETAMETHASONE VALER 0.1% OI: 0.1 | 22 days supply | Qty: 45 | Fill #1

## 2020-05-16 ENCOUNTER — Other Ambulatory Visit (HOSPITAL_COMMUNITY): Payer: Self-pay

## 2020-05-16 MED ORDER — CHOLECALCIFEROL 1.25 MG (50000 UT) PO CAPS
50000.0000 [IU] | ORAL_CAPSULE | ORAL | 0 refills | Status: DC
  Filled 2020-05-16: qty 8, 56d supply, fill #0

## 2020-05-17 ENCOUNTER — Other Ambulatory Visit: Payer: Self-pay | Admitting: Obstetrics and Gynecology

## 2020-05-17 DIAGNOSIS — E049 Nontoxic goiter, unspecified: Secondary | ICD-10-CM

## 2020-05-21 ENCOUNTER — Other Ambulatory Visit: Payer: Self-pay | Admitting: Obstetrics and Gynecology

## 2020-05-21 DIAGNOSIS — E049 Nontoxic goiter, unspecified: Secondary | ICD-10-CM

## 2020-05-24 ENCOUNTER — Ambulatory Visit
Admission: RE | Admit: 2020-05-24 | Discharge: 2020-05-24 | Disposition: A | Discharge status: Home / Self Care | Payer: 59 | Source: Ambulatory Visit | Attending: Obstetrics and Gynecology | Admitting: Obstetrics and Gynecology

## 2020-05-24 DIAGNOSIS — E049 Nontoxic goiter, unspecified: Secondary | ICD-10-CM

## 2020-08-12 ENCOUNTER — Ambulatory Visit: Payer: 59 | Admitting: Internal Medicine

## 2020-08-12 ENCOUNTER — Encounter: Payer: Self-pay | Admitting: Internal Medicine

## 2020-08-12 ENCOUNTER — Other Ambulatory Visit (HOSPITAL_COMMUNITY): Payer: Self-pay

## 2020-08-12 ENCOUNTER — Other Ambulatory Visit: Payer: Self-pay

## 2020-08-12 DIAGNOSIS — B351 Tinea unguium: Secondary | ICD-10-CM | POA: Diagnosis not present

## 2020-08-12 MED ORDER — TERBINAFINE HCL 250 MG PO TABS
250.0000 mg | ORAL_TABLET | Freq: Every day | ORAL | 0 refills | Status: DC
  Filled 2020-08-12: qty 30, 30d supply, fill #0

## 2020-08-12 NOTE — Patient Instructions (Signed)
We have sent in the lamisil to take 1 pill daily for 1 week, then skip 3 weeks, then take 1 pill daily for 1 week, then skip 3 weeks, then take 1 pill daily for 1 week.

## 2020-08-12 NOTE — Progress Notes (Signed)
   Subjective:   Patient ID: Monica Mclaughlin, female    DOB: 12/05/1990, 30 y.o.   MRN: [id number]  HPI The patient is a 30 YO female coming in for toenail fungus. Noticed it when taking off polish after pedicure 1 month ago. Denies pain or itching. Overall stable On both big toes.   Review of Systems  Constitutional: Negative.   HENT: Negative.    Eyes: Negative.   Respiratory:  Negative for cough, chest tightness and shortness of breath.   Cardiovascular:  Negative for chest pain, palpitations and leg swelling.  Gastrointestinal:  Negative for abdominal distention, abdominal pain, constipation, diarrhea, nausea and vomiting.  Musculoskeletal: Negative.   Skin:  Positive for color change.  Neurological: Negative.   Psychiatric/Behavioral: Negative.     Objective:  Physical Exam Constitutional:      Appearance: She is well-developed.  HENT:     Head: Normocephalic and atraumatic.  Cardiovascular:     Rate and Rhythm: Normal rate and regular rhythm.  Pulmonary:     Effort: Pulmonary effort is normal. No respiratory distress.     Breath sounds: Normal breath sounds. No wheezing or rales.  Abdominal:     General: Bowel sounds are normal. There is no distension.     Palpations: Abdomen is soft.     Tenderness: There is no abdominal tenderness. There is no rebound.  Musculoskeletal:     Cervical back: Normal range of motion.  Skin:    General: Skin is warm and dry.     Comments: 1st toes bilaterally with color change associated with fungus  Neurological:     Mental Status: She is alert and oriented to person, place, and time.     Coordination: Coordination normal.    Vitals:   08/12/20 1600  BP: 118/80  Pulse: 85  Resp: 18  Temp: 98.9 F (37.2 C)  TempSrc: Oral  SpO2: 99%  Weight: 162 lb 9.6 oz (73.8 kg)  Height: 5\' 1"  (1.549 m)    This visit occurred during the SARS-CoV-2 public health emergency.  Safety protocols were in place, including screening questions prior  to the visit, additional usage of staff PPE, and extensive cleaning of exam room while observing appropriate contact time as indicated for disinfecting solutions.   Assessment & Plan:

## 2020-08-13 ENCOUNTER — Encounter: Payer: Self-pay | Admitting: Internal Medicine

## 2020-08-13 DIAGNOSIS — B351 Tinea unguium: Secondary | ICD-10-CM | POA: Insufficient documentation

## 2020-08-13 NOTE — Assessment & Plan Note (Signed)
Rx lamisil to take 1 pill daily for 1 week, then skip for 3 weeks, then take 1 pill daily for 1 week, then skip 3 weeks, then take 1 pill daily for 1 week then stop. We discussed that it can take 3-6 months after stopping medicine to understand if this will work and has about 30-40% success rate.

## 2020-08-30 ENCOUNTER — Ambulatory Visit (INDEPENDENT_AMBULATORY_CARE_PROVIDER_SITE_OTHER): Payer: 59 | Admitting: Internal Medicine

## 2020-08-30 ENCOUNTER — Other Ambulatory Visit (HOSPITAL_COMMUNITY): Payer: Self-pay

## 2020-08-30 ENCOUNTER — Other Ambulatory Visit: Payer: Self-pay

## 2020-08-30 ENCOUNTER — Other Ambulatory Visit: Payer: Self-pay | Admitting: Internal Medicine

## 2020-08-30 ENCOUNTER — Encounter: Payer: Self-pay | Admitting: Internal Medicine

## 2020-08-30 VITALS — BP 118/70 | HR 58 | Temp 97.6°F | Resp 18 | Ht 61.0 in | Wt 160.6 lb

## 2020-08-30 DIAGNOSIS — E559 Vitamin D deficiency, unspecified: Secondary | ICD-10-CM

## 2020-08-30 DIAGNOSIS — F17200 Nicotine dependence, unspecified, uncomplicated: Secondary | ICD-10-CM

## 2020-08-30 DIAGNOSIS — Z0001 Encounter for general adult medical examination with abnormal findings: Secondary | ICD-10-CM

## 2020-08-30 MED ORDER — BETAMETHASONE VALERATE 0.1 % EX OINT
TOPICAL_OINTMENT | Freq: Two times a day (BID) | CUTANEOUS | 1 refills | Status: DC
  Filled 2020-08-30 – 2020-09-11 (×2): qty 45, 30d supply, fill #0

## 2020-08-30 MED ORDER — VITAMIN D (ERGOCALCIFEROL) 1.25 MG (50000 UNIT) PO CAPS
50000.0000 [IU] | ORAL_CAPSULE | ORAL | 3 refills | Status: DC
  Filled 2020-08-30 – 2020-09-11 (×2): qty 4, 28d supply, fill #0

## 2020-08-30 NOTE — Progress Notes (Signed)
   Subjective:   Patient ID: Monica Mclaughlin, female    DOB: 23-Apr-1990, 30 y.o.   MRN: [id number]  HPI The patient is a 30 YO female coming in for physical. Wants to talk about options for smoking cessation.  PMH, [facility], social history reviewed and updated  Review of Systems  Constitutional: Negative.   HENT: Negative.    Eyes: Negative.   Respiratory:  Negative for cough, chest tightness and shortness of breath.   Cardiovascular:  Negative for chest pain, palpitations and leg swelling.  Gastrointestinal:  Negative for abdominal distention, abdominal pain, constipation, diarrhea, nausea and vomiting.  Musculoskeletal: Negative.   Skin: Negative.   Neurological: Negative.   Psychiatric/Behavioral: Negative.     Objective:  Physical Exam Constitutional:      Appearance: She is well-developed.  HENT:     Head: Normocephalic and atraumatic.  Cardiovascular:     Rate and Rhythm: Normal rate and regular rhythm.  Pulmonary:     Effort: Pulmonary effort is normal. No respiratory distress.     Breath sounds: Normal breath sounds. No wheezing or rales.  Abdominal:     General: Bowel sounds are normal. There is no distension.     Palpations: Abdomen is soft.     Tenderness: There is no abdominal tenderness. There is no rebound.  Musculoskeletal:     Cervical back: Normal range of motion.  Skin:    General: Skin is warm and dry.  Neurological:     Mental Status: She is alert and oriented to person, place, and time.     Coordination: Coordination normal.    Vitals:   08/30/20 0850  BP: 118/70  Pulse: (!) 58  Resp: 18  Temp: 97.6 F (36.4 C)  SpO2: 98%  Weight: 160 lb 9.6 oz (72.8 kg)  Height: 5\' 1"  (1.549 m)    This visit occurred during the SARS-CoV-2 public health emergency.  Safety protocols were in place, including screening questions prior to the visit, additional usage of staff PPE, and extensive cleaning of exam room while observing appropriate contact time as  indicated for disinfecting solutions.   Assessment & Plan:

## 2020-08-30 NOTE — Patient Instructions (Signed)
We have sent in the refill of the vitamin D.   Think about wellbutrin to stop smoking or 1800QUITNOW or quitnow.org

## 2020-08-30 NOTE — Assessment & Plan Note (Signed)
Flu shot yearly. Covid-19 counseled up to date. Tetanus up to date. Pap smear up to date with gyn. Counseled about sun safety and mole surveillance. Counseled about the dangers of distracted driving. Given 10 year screening recommendations.

## 2020-08-30 NOTE — Assessment & Plan Note (Signed)
We talked in great detail about options for smoking of wellbutrin/nicotine replacement/other. She is not at stage of wanting to set date and will think about it and let us know. Reminded her of risk/harm from smoking.

## 2020-08-30 NOTE — Assessment & Plan Note (Signed)
Vitamin D levels checked with ob/gyn and low. Needs refill on 50000 units weekly vitamin D which is done today.

## 2020-09-10 ENCOUNTER — Other Ambulatory Visit (HOSPITAL_COMMUNITY): Payer: Self-pay

## 2020-09-11 ENCOUNTER — Other Ambulatory Visit (HOSPITAL_COMMUNITY): Payer: Self-pay

## 2020-12-26 ENCOUNTER — Other Ambulatory Visit (HOSPITAL_COMMUNITY): Payer: Self-pay

## 2020-12-26 ENCOUNTER — Encounter: Payer: Self-pay | Admitting: Family Medicine

## 2020-12-26 ENCOUNTER — Telehealth (INDEPENDENT_AMBULATORY_CARE_PROVIDER_SITE_OTHER): Payer: 59 | Admitting: Family Medicine

## 2020-12-26 DIAGNOSIS — J111 Influenza due to unidentified influenza virus with other respiratory manifestations: Secondary | ICD-10-CM | POA: Diagnosis not present

## 2020-12-26 MED ORDER — BENZONATATE 100 MG PO CAPS
100.0000 mg | ORAL_CAPSULE | Freq: Two times a day (BID) | ORAL | 0 refills | Status: DC | PRN
  Filled 2020-12-26: qty 30, 8d supply, fill #0

## 2020-12-26 NOTE — Progress Notes (Signed)
Virtual Visit via Video Note  I connected with Levora  on 12/26/20 at 12:00 PM EST by a video enabled telemedicine application and verified that I am speaking with the correct person using two identifiers.  Location patient: home, [place] Location provider:work or home office Persons participating in the virtual visit: patient, provider  I discussed the limitations of evaluation and management by telemedicine and the availability of in person appointments. The patient expressed understanding and agreed to proceed.   HPI:  Acute telemedicine visit for flu like symptoms: -Onset: 2-3 days ago -daughter has influenza - dx at doctor office -Symptoms include: fevers, chills, body aches, cough, sore throat -Denies:  CP, SOB, NVD -drinking fluids, poor appetite -Has tried:tylenol -Pertinent past medical history: see below -Pertinent medication allergies:  Allergies  Allergen Reactions   Betadine [Povidone Iodine] Shortness Of Breath   Shrimp Extract Allergy Skin Test Anaphylaxis   Shellfish Allergy Swelling    Swelling of throat  -COVID-19 vaccine status:  Immunization History  Administered Date(s) Administered   Influenza,inj,quad, With Preservative 10/14/2015   Influenza-Unspecified 12/05/2017   PFIZER(Purple Top)SARS-COV-2 Vaccination 08/28/2019, 09/18/2019   Pneumococcal Polysaccharide-23 09/12/2011    ROS: See pertinent positives and negatives per HPI.  Past Medical History:  Diagnosis Date   Abnormal Pap smear 2011   ASCUS (atypical squamous cells of undetermined significance) on Pap smear    ASCUS with positive high risk HPV 06/2006   Candidiasis    Dysplasia of cervix, low grade (CIN 1) 2008   Dysplasia of cervix, low grade (CIN 1)    Eczema    H/O dysmenorrhea    Monilial vulvovaginitis 08/2005   Sickle cell trait (HCC)    Vulvovaginitis     Past Surgical History:  Procedure Laterality Date   WISDOM TOOTH EXTRACTION       Current Outpatient Medications:     benzonatate (TESSALON PERLES) 100 MG capsule, 1-2 capsules up to twice daily as needed for cough, Disp: 30 capsule, Rfl: 0   betamethasone valerate ointment (VALISONE) 0.1 %, Apply topically 2 times daily, Disp: 45 g, Rfl: 1   cyclobenzaprine (FLEXERIL) 5 MG tablet, Take 1 tablet (5 mg total) by mouth 3 (three) times daily as needed for muscle spasms., Disp: 30 tablet, Rfl: 1   Etonogestrel (NEXPLANON [place]), Inject into the skin., Disp: , Rfl:    terbinafine (LAMISIL) 250 MG tablet, Take 1 tablet (250 mg total) by mouth daily., Disp: 30 tablet, Rfl: 0  EXAM:  VITALS per patient if applicable: T 99.4  GENERAL: alert, oriented, appears well and in no acute distress  HEENT: atraumatic, conjunttiva clear, no obvious abnormalities on inspection of external nose and ears  NECK: normal movements of the head and neck  LUNGS: on inspection no signs of respiratory distress, breathing rate appears normal, no obvious gross SOB, gasping or wheezing  CV: no obvious cyanosis  MS: moves all visible extremities without noticeable abnormality  PSYCH/NEURO: pleasant and cooperative, no obvious depression or anxiety, speech and thought processing grossly intact  ASSESSMENT AND PLAN:  Discussed the following assessment and plan:  Influenza  -we discussed possible serious and likely etiologies, options for evaluation and workup, limitations of telemedicine visit vs in person visit, treatment, treatment risks and precautions. Pt is agreeable to treatment via telemedicine at this moment. Likely flu. Did discuss other bugs are going around in the community as well, including covid. She opted for Tessalon, fluid and other symptomatic care measures per patient instructions.  Work/School slipped offered: provided  in patient instructions   Advised to seek prompt in person care if worsening, new symptoms arise, or if is not improving with treatment. Discussed options for inperson care if PCP office not available.  Did let this patient know that I do telemedicine on Tuesdays and Thursdays for [place]. Advised to schedule follow up visit with PCP or [facility] if any further questions or concerns to avoid delays in care.   I discussed the assessment and treatment plan with the patient. The patient was provided an opportunity to ask questions and all were answered. The patient agreed with the plan and demonstrated an understanding of the instructions.     Lucretia Kern, DO

## 2020-12-26 NOTE — Patient Instructions (Signed)
° ° ° °--------------------------------------------------------------------------------------------------------------------------- ° ° ° °  WORK SLIP:  Patient Monica Mclaughlin,  [birth year], was seen for a medical visit today, 12/26/20 . Please excuse from work for a COVID/flu like illness. Many people are contagious with the flu for about 7 days. With covid most folks are contagious for about 7-10 days.  Sincerely: E-signature: Dr. Kriste Basque, DO [facility] [place] Ph: [phone]   ------------------------------------------------------------------------------------------------------------------------------  -I sent the medication(s) we discussed to your pharmacy: Meds ordered this encounter  Medications   benzonatate (TESSALON PERLES) 100 MG capsule    Sig: 1-2 capsules up to twice daily as needed for cough    Dispense:  30 capsule    Refill:  0   Tylenol or aleve if needed per instructions.  Drink plenty of fluids - avoid dairy  Steam and/or a humidifier can sometimes help  Can use nasal saline and/or a 3 day course of Afrin nasal spry for nasal congestion if needed  I hope you are feeling better soon!  Seek in person care promptly if your symptoms worsen, new concerns arise or you are not improving with treatment.  It was nice to meet you today. I help [facility] out with telemedicine visits on Tuesdays and Thursdays and am happy to help if you need a virtual follow up visit on those days. Otherwise, if you have any concerns or questions following this visit please schedule a follow up visit with your Primary Care office or seek care at a local urgent care clinic to avoid delays in care

## 2021-08-13 ENCOUNTER — Other Ambulatory Visit (HOSPITAL_COMMUNITY): Payer: Self-pay

## 2021-08-13 ENCOUNTER — Ambulatory Visit: Payer: 59 | Admitting: Family Medicine

## 2021-08-13 ENCOUNTER — Encounter: Payer: Self-pay | Admitting: Family Medicine

## 2021-08-13 VITALS — BP 102/58 | HR 63 | Temp 97.6°F | Ht 61.0 in | Wt 152.0 lb

## 2021-08-13 DIAGNOSIS — J039 Acute tonsillitis, unspecified: Secondary | ICD-10-CM

## 2021-08-13 DIAGNOSIS — R590 Localized enlarged lymph nodes: Secondary | ICD-10-CM

## 2021-08-13 DIAGNOSIS — J351 Hypertrophy of tonsils: Secondary | ICD-10-CM

## 2021-08-13 LAB — CBC WITH DIFFERENTIAL/PLATELET
Basophils Absolute: 0 10*3/uL (ref 0.0–0.1)
Basophils Relative: 0.5 % (ref 0.0–3.0)
Eosinophils Absolute: 0.1 10*3/uL (ref 0.0–0.7)
Eosinophils Relative: 1.4 % (ref 0.0–5.0)
HCT: 37.5 % (ref 36.0–46.0)
Hemoglobin: 12.4 g/dL (ref 12.0–15.0)
Lymphocytes Relative: 19.9 % (ref 12.0–46.0)
Lymphs Abs: 1.7 10*3/uL (ref 0.7–4.0)
MCHC: 32.9 g/dL (ref 30.0–36.0)
MCV: 84.1 fl (ref 78.0–100.0)
Monocytes Absolute: 0.9 10*3/uL (ref 0.1–1.0)
Monocytes Relative: 10.5 % (ref 3.0–12.0)
Neutro Abs: 5.7 10*3/uL (ref 1.4–7.7)
Neutrophils Relative %: 67.7 % (ref 43.0–77.0)
Platelets: 168 10*3/uL (ref 150.0–400.0)
RBC: 4.46 Mil/uL (ref 3.87–5.11)
RDW: 14.4 % (ref 11.5–15.5)
WBC: 8.4 10*3/uL (ref 4.0–10.5)

## 2021-08-13 LAB — POCT RAPID STREP A (OFFICE): Rapid Strep A Screen: NEGATIVE

## 2021-08-13 LAB — POC COVID19 BINAXNOW: SARS Coronavirus 2 Ag: NEGATIVE

## 2021-08-13 MED ORDER — AMOXICILLIN-POT CLAVULANATE 875-125 MG PO TABS
1.0000 | ORAL_TABLET | Freq: Two times a day (BID) | ORAL | 0 refills | Status: DC
  Filled 2021-08-13: qty 20, 10d supply, fill #0

## 2021-08-13 MED ORDER — METHYLPREDNISOLONE ACETATE 40 MG/ML IJ SUSP
40.0000 mg | Freq: Once | INTRAMUSCULAR | Status: AC
  Administered 2021-08-13: 40 mg via INTRAMUSCULAR

## 2021-08-13 MED ORDER — PREDNISONE 10 MG (21) PO TBPK
ORAL_TABLET | Freq: Every day | ORAL | 0 refills | Status: DC
  Filled 2021-08-13: qty 21, 6d supply, fill #0

## 2021-08-13 NOTE — Patient Instructions (Signed)
You are negative for strep throat and COVID. You were given a steroid injection in the office today.  Start the antibiotics today and the oral steroid Dosepak tomorrow.  Use salt water gargles as discussed. Stay well-hydrated.   If you are getting worse such as more swelling, pain, difficulty swallowing or spikes a fever then you will need to go to the emergency department.   Tonsillitis  Tonsillitis is an infection of the throat that causes the tonsils to become red, tender, and swollen. Tonsils are tissues in the back of your throat. Each tonsil has crevices (crypts). Tonsils normally work to protect the body from infection. What are the causes? Sudden (acute) tonsillitis may be caused by a virus or bacteria, including streptococcal bacteria. Long-lasting (chronic) tonsillitis occurs when the crypts of the tonsils become filled with pieces of food and bacteria, which makes it easy for the tonsils to become repeatedly infected. Tonsillitis can be spread from person to person when it is caused by a virus or bacteria. It may be spread by inhaling droplets that are released with coughing or sneezing. You may also come into contact with viruses or bacteria on surfaces, such as cups or utensils. What are the signs or symptoms? Symptoms of this condition include: A sore throat. This may include trouble swallowing. White patches on the tonsils. Swollen tonsils. Fever. Headache. Tiredness. Loss of appetite. Snoring during sleep when you did not snore before. Small, foul-smelling, yellowish-white pieces of material (tonsilloliths) that you occasionally cough up or spit out. These can cause you to have bad breath. How is this diagnosed? This condition is diagnosed with a physical exam. Diagnosis can be confirmed with the results of lab tests, including a throat culture. How is this treated? Treatment for this condition depends on the cause, but usually focuses on treating the symptoms  associated with it. Treatment may include: Medicines to relieve pain and manage fever. Steroid medicines to reduce swelling. Antibiotic medicines if the condition is caused by bacteria. If episodes of tonsillitis are severe and frequent, your health care provider may recommend surgery to remove the tonsils (tonsillectomy). Follow these instructions at home: Medicines Take over-the-counter and prescription medicines only as told by your health care provider. If you were prescribed an antibiotic medicine, take it as told by your health care provider. Do not stop taking the antibiotic even if you start to feel better. Eating and drinking Drink enough fluid to keep your urine pale yellow. While your throat is sore, eat soft or liquid foods, such as sherbet, soups, or soft, warm cereals, such as oatmeal or hot wheat cereal. Drink warm liquids. Eat frozen ice pops. General instructions Rest as much as possible and get plenty of sleep. Gargle with a mixture of salt and water 3-4 times a day or as needed. To make salt water, completely dissolve -1 tsp (3-6 g) of salt in 1 cup (237 mL) of warm water. Do not swallow the mixture of salt and water. Wash your hands regularly with soap and water for at least 20 seconds. If soap and water are not available, use hand sanitizer. Do not share cups, bottles, or other utensils until your symptoms have gone away. Do not use any products that contain nicotine or tobacco. These products include cigarettes, chewing tobacco, and vaping devices, such as e-cigarettes. If you need help quitting, ask your health care provider. Keep all follow-up visits. This is important. Contact a health care provider if: You notice large, tender lumps in your neck  that were not there before. You have a fever that does not go away after 2-3 days. You develop a rash. You cough up a green, yellow-brown, or bloody substance. You cannot swallow liquids or food for 24 hours. Only one  of your tonsils is swollen. Get help right away if: You develop any new symptoms, such as vomiting, severe headache, stiff neck, chest pain, trouble breathing, or trouble swallowing. You have severe throat pain along with drooling or voice changes. You have severe pain that is not controlled with medicines. You cannot fully open your mouth. You develop redness, swelling, or severe pain anywhere in your neck. Summary Tonsillitis is an infection of the throat that causes the tonsils to become red, tender, and swollen. The most common symptom is pain in the throat. Tonsillitis is most often caused by a virus or bacteria. Get help right away if you develop any new symptoms, such as vomiting, severe headache, stiff neck, chest pain, or trouble breathing. This information is not intended to replace advice given to you by your health care provider. Make sure you discuss any questions you have with your health care provider. Document Revised: 05/16/2020 Document Reviewed: 05/16/2020 Elsevier Patient Education  2023 [employer].

## 2021-08-13 NOTE — Progress Notes (Signed)
Subjective:     Patient ID: Monica Mclaughlin, female    DOB: Oct 22, 1990, 31 y.o.   MRN: [id number]  Chief Complaint  Patient presents with   swollen tonsil    Swollen left tonsil since 8/5 accompanied by neck swelling. Also states her right ear feels as if there's fluid in it and is causing a burning sensation and when air gets in it it irritates it more.     HPI Patient is in today for a 4 day hx of left sided throat pain and tonsil swelling. Also reports left ear pain, facial pain and swollen, tender lymph nodes on left.   Chills at onset of symptoms.  Denies fever, dizziness, headache, trouble breathing or swallowing. No chest pain, shortness of breath, abdominal pain, N/V/D.   Health Maintenance Due  Topic Date Due   Hepatitis C Screening  Never done   TETANUS/TDAP  Never done   COVID-19 Vaccine (3 - Pfizer series) 11/13/2019   INFLUENZA VACCINE  08/05/2021    Past Medical History:  Diagnosis Date   Abnormal Pap smear 2011   ASCUS (atypical squamous cells of undetermined significance) on Pap smear    ASCUS with positive high risk HPV 06/2006   Candidiasis    Dysplasia of cervix, low grade (CIN 1) 2008   Dysplasia of cervix, low grade (CIN 1)    Eczema    H/O dysmenorrhea    Monilial vulvovaginitis 08/2005   Sickle cell trait (HCC)    Vulvovaginitis     Past Surgical History:  Procedure Laterality Date   WISDOM TOOTH EXTRACTION      Family History  Problem Relation Age of Onset   Hypertension Mother    Asthma Mother    Alcohol abuse Father    Hypertension Maternal Aunt    Hypertension Maternal Grandmother    Diabetes Maternal Grandfather    Cancer Maternal Grandfather     Social History   Socioeconomic History   Marital status: Single    Spouse name: Not on file   Number of children: Not on file   Years of education: Not on file   Highest education level: Not on file  Occupational History   Not on file  Tobacco Use   Smoking status: Every Day     Packs/day: 1.00    Years: 15.00    Total pack years: 15.00    Types: Cigarettes    Start date: 2006   Smokeless tobacco: Never  Substance and Sexual Activity   Alcohol use: No   Drug use: No   Sexual activity: Not on file    Comment: nexplanon   Other Topics Concern   Not on file  Social History Narrative   Not on file   Social Determinants of Health   Financial Resource Strain: Not on file  Food Insecurity: Not on file  Transportation Needs: Not on file  Physical Activity: Not on file  Stress: Not on file  Social Connections: Not on file  Intimate Partner Violence: Not on file    Outpatient Medications Prior to Visit  Medication Sig Dispense Refill   betamethasone valerate ointment (VALISONE) 0.1 % Apply topically 2 times daily 45 g 1   cyclobenzaprine (FLEXERIL) 5 MG tablet Take 1 tablet (5 mg total) by mouth 3 (three) times daily as needed for muscle spasms. (Patient not taking: Reported on 08/13/2021) 30 tablet 1   benzonatate (TESSALON PERLES) 100 MG capsule Take 1-2 capsules (100-200 mg total) by mouth up to  2 (two) times daily as needed for cough 30 capsule 0   Etonogestrel (NEXPLANON [place]) Inject into the skin.     terbinafine (LAMISIL) 250 MG tablet Take 1 tablet (250 mg total) by mouth daily. 30 tablet 0   No facility-administered medications prior to visit.    Allergies  Allergen Reactions   Betadine [Povidone Iodine] Shortness Of Breath   Shrimp Extract Allergy Skin Test Anaphylaxis   Shellfish Allergy Swelling    Swelling of throat    ROS     Objective:    Physical Exam Constitutional:      General: She is not in acute distress.    Appearance: She is not toxic-appearing.  HENT:     Right Ear: Tympanic membrane and ear canal normal.     Left Ear: Tympanic membrane and ear canal normal.     Mouth/Throat:     Lips: Pink.     Mouth: Mucous membranes are moist.     Tongue: No lesions.     Palate: No mass and lesions.     Pharynx: Uvula midline.  Pharyngeal swelling, oropharyngeal exudate and posterior oropharyngeal erythema present. No uvula swelling.     Tonsils: No tonsillar abscesses. 0 on the right. 2+ on the left.     Comments: Left sided tonsil edema 2+ Eyes:     Extraocular Movements: Extraocular movements intact.     Conjunctiva/sclera: Conjunctivae normal.     Pupils: Pupils are equal, round, and reactive to light.  Neck:     Thyroid: No thyroid tenderness.     Comments: Left anterior cervical lymph node enlargement with TTP  Cardiovascular:     Rate and Rhythm: Normal rate and regular rhythm.  Pulmonary:     Effort: Pulmonary effort is normal.     Breath sounds: Normal breath sounds.  Musculoskeletal:        General: Normal range of motion.     Cervical back: Normal range of motion and neck supple.  Lymphadenopathy:     Cervical: Cervical adenopathy present.  Skin:    General: Skin is warm and dry.     Findings: No rash.  Neurological:     General: No focal deficit present.     Mental Status: She is alert and oriented to person, place, and time.     Motor: No weakness.  Psychiatric:        Mood and Affect: Mood normal.        Speech: Speech normal.        Behavior: Behavior normal.        Thought Content: Thought content normal.     BP (!) 102/58 (BP Location: Left Arm, Patient Position: Sitting, Cuff Size: Large)   Pulse 63   Temp 97.6 F (36.4 C) (Temporal)   Ht 5\' 1"  (1.549 m)   Wt 152 lb (68.9 kg)   LMP 08/01/2021   SpO2 97%   BMI 28.72 kg/m  Wt Readings from Last 3 Encounters:  08/13/21 152 lb (68.9 kg)  08/30/20 160 lb 9.6 oz (72.8 kg)  08/12/20 162 lb 9.6 oz (73.8 kg)       Assessment & Plan:   Problem List Items Addressed This Visit   None Visit Diagnoses     Tonsillitis    -  Primary   Relevant Medications   amoxicillin-clavulanate (AUGMENTIN) 875-125 MG tablet   predniSONE (STERAPRED UNI-PAK 21 TAB) 10 MG (21) TBPK tablet   Other Relevant Orders   POCT rapid strep A  (  Completed)   CBC with Differential/Platelet (Completed)   Epstein-Barr virus nuclear antigen antibody, IgG   Tonsillar hypertrophy, unilateral       Relevant Medications   methylPREDNISolone acetate (DEPO-MEDROL) injection 40 mg (Completed)   Other Relevant Orders   CBC with Differential/Platelet (Completed)   Epstein-Barr virus nuclear antigen antibody, IgG   POC COVID-19 (Completed)   Left cervical lymphadenopathy       Relevant Medications   methylPREDNISolone acetate (DEPO-MEDROL) injection 40 mg (Completed)   Other Relevant Orders   CBC with Differential/Platelet (Completed)      Negative rapid strep.  Negative Covid Stat CBC and EBV testing.  Depo-Medrol  40 mg IM given in office.  Augmentin prescribed to start today.  Oral prednisone dose pak to start tomorrow.   Addendum: CBC normal. Pending EBV result.  Follow up if worsening or not back to baseline when she completes the antibiotic.  Strict precaution to go to the ED if she develops worsening swelling, pain, high fever, or difficulty swallowing or speaking.   Visit time 30 minutes in face to face communication with patient and coordination of care, additional 15 minutes spent in record review, coordination or care, ordering tests, communicating/referring to other healthcare professionals, documenting in medical records all on the same day of the visit for total time 45 minutes spent on the visit.    I have discontinued Franco Collet. Pant's Etonogestrel (NEXPLANON [place]), terbinafine, and benzonatate. I am also having her start on amoxicillin-clavulanate and predniSONE. Additionally, I am having her maintain her cyclobenzaprine and betamethasone valerate ointment. We administered methylPREDNISolone acetate.  Meds ordered this encounter  Medications   methylPREDNISolone acetate (DEPO-MEDROL) injection 40 mg   amoxicillin-clavulanate (AUGMENTIN) 875-125 MG tablet    Sig: Take 1 tablet by mouth 2 (two) times daily.     Dispense:  20 tablet    Refill:  0    Order Specific Question:   Supervising Provider    Answer:   Hillard Danker A [[id number]]   predniSONE (STERAPRED UNI-PAK 21 TAB) 10 MG (21) TBPK tablet    Sig: Take as directed.    Dispense:  21 tablet    Refill:  0    Order Specific Question:   Supervising Provider    Answer:   Hillard Danker A [[id number]]

## 2021-08-14 LAB — EPSTEIN-BARR VIRUS NUCLEAR ANTIGEN ANTIBODY, IGG: EBV NA IgG: >600 U/mL — ABNORMAL HIGH

## 2021-08-14 NOTE — Progress Notes (Signed)
Called patient and explained that she is positive for Mono. Counseling on self care and contagious nature. She will stop antibiotic and continue oral steroids. Follow up if worsening.

## 2021-12-12 ENCOUNTER — Ambulatory Visit
Admission: EM | Admit: 2021-12-12 | Discharge: 2021-12-12 | Disposition: A | Discharge status: Home / Self Care | Payer: 59 | Attending: Physician Assistant | Admitting: Physician Assistant

## 2021-12-12 ENCOUNTER — Encounter: Payer: Self-pay | Admitting: Internal Medicine

## 2021-12-12 DIAGNOSIS — L509 Urticaria, unspecified: Secondary | ICD-10-CM

## 2021-12-12 MED ORDER — PREDNISONE 20 MG PO TABS: 40.0000 mg | ORAL_TABLET | Freq: Every day | ORAL | 0 refills | Status: AC

## 2021-12-12 MED ORDER — HYDROXYZINE HCL 25 MG PO TABS: 25.0000 mg | ORAL_TABLET | Freq: Four times a day (QID) | ORAL | 0 refills | Status: DC
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
## 2021-12-12 NOTE — ED Provider Notes (Signed)
[facility]    CSN: [id number] Arrival date & time: 12/12/21  1827      History   Chief Complaint No chief complaint on file.   HPI Monica Mclaughlin is a 31 y.o. female.   Patient here today for e  The history is provided by the patient.    Past Medical History:  Diagnosis Date   Abnormal Pap smear 2011   ASCUS (atypical squamous cells of undetermined significance) on Pap smear    ASCUS with positive high risk HPV 06/2006   Candidiasis    Dysplasia of cervix, low grade (CIN 1) 2008   Dysplasia of cervix, low grade (CIN 1)    Eczema    H/O dysmenorrhea    Monilial vulvovaginitis 08/2005   Sickle cell trait (HCC)    Vulvovaginitis     Patient Active Problem List   Diagnosis Date Noted   Vitamin D deficiency 08/30/2020   Encounter for general adult medical examination with abnormal findings 08/30/2020   Toenail fungus 08/13/2020   Acute bilateral low back pain with bilateral sciatica 07/07/2019   Tonsil stone 07/04/2019   Eczema 01/10/2018   Esophageal reflux 05/14/2015   Hemorrhoids 10/10/2014   Irregular heart beats 09/10/2011   Smoker 06/18/2011    Past Surgical History:  Procedure Laterality Date   WISDOM TOOTH EXTRACTION      OB History     Gravida  1   Para  1   Term  1   Preterm  0   AB  0   Living  1      SAB  0   IAB  0   Ectopic  0   Multiple  0   Live Births  1            Home Medications    Prior to Admission medications   Medication Sig Start Date End Date Taking? Authorizing Provider  amoxicillin-clavulanate (AUGMENTIN) 875-125 MG tablet Take 1 tablet by mouth 2 (two) times daily. 08/13/21   Henson, Vickie L, NP-C  betamethasone valerate ointment (VALISONE) 0.1 % Apply topically 2 times daily 08/30/20   Myrlene Broker, MD  cyclobenzaprine (FLEXERIL) 5 MG tablet Take 1 tablet (5 mg total) by mouth 3 (three) times daily as needed for muscle spasms. Patient not taking: Reported on 08/13/2021 07/07/19    Myrlene Broker, MD  predniSONE (STERAPRED UNI-PAK 21 TAB) 10 MG (21) TBPK tablet Take as directed. 08/13/21   Avanell Shackleton, NP-C    Family History Family History  Problem Relation Age of Onset   Hypertension Mother    Asthma Mother    Alcohol abuse Father    Hypertension Maternal Aunt    Hypertension Maternal Grandmother    Diabetes Maternal Grandfather    Cancer Maternal Grandfather     Social History Social History   Tobacco Use   Smoking status: Every Day    Packs/day: 1.00    Years: 15.00    Total pack years: 15.00    Types: Cigarettes    Start date: 2006   Smokeless tobacco: Never  Substance Use Topics   Alcohol use: No   Drug use: No     Allergies   Betadine [povidone iodine], Shrimp extract allergy skin test, and Shellfish allergy   Review of Systems Review of Systems  Constitutional:  Negative for chills and fever.  Eyes:  Negative for discharge and redness.  Gastrointestinal:  Negative for abdominal pain, nausea and vomiting.  Skin:  Positive for rash.     Physical Exam Triage Vital Signs ED Triage Vitals  Enc Vitals Group     BP      Pulse      Resp      Temp      Temp src      SpO2      Weight      Height      Head Circumference      Peak Flow      Pain Score      Pain Loc      Pain Edu?      Excl. in [place]?    No data found.  Updated Vital Signs There were no vitals taken for this visit.    Physical Exam Vitals and nursing note reviewed.  Constitutional:      General: She is not in acute distress.    Appearance: Normal appearance. She is not ill-appearing.  HENT:     Head: Normocephalic and atraumatic.  Eyes:     Conjunctiva/sclera: Conjunctivae normal.  Cardiovascular:     Rate and Rhythm: Normal rate.  Pulmonary:     Effort: Pulmonary effort is normal.  Neurological:     Mental Status: She is alert.  Psychiatric:        Mood and Affect: Mood normal.        Behavior: Behavior normal.        Thought Content:  Thought content normal.      UC Treatments / Results  Labs (all labs ordered are listed, but only abnormal results are displayed) Labs Reviewed - No data to display  EKG   Radiology No results found.  Procedures Procedures (including critical care time)  Medications Ordered in UC Medications - No data to display  Initial Impression / Assessment and Plan / UC Course  I have reviewed the triage vital signs and the nursing notes.  Pertinent labs & imaging results that were available during my care of the patient were reviewed by me and considered in my medical decision making (see chart for details).     *** Final Clinical Impressions(s) / UC Diagnoses   Final diagnoses:  None   Discharge Instructions   None    ED Prescriptions   None    PDMP not reviewed this encounter.

## 2021-12-13 ENCOUNTER — Encounter: Payer: Self-pay | Admitting: Physician Assistant

## 2021-12-22 ENCOUNTER — Other Ambulatory Visit (HOSPITAL_COMMUNITY): Payer: Self-pay

## 2021-12-22 ENCOUNTER — Ambulatory Visit (INDEPENDENT_AMBULATORY_CARE_PROVIDER_SITE_OTHER): Payer: 59 | Admitting: Internal Medicine

## 2021-12-22 ENCOUNTER — Encounter: Payer: Self-pay | Admitting: Internal Medicine

## 2021-12-22 VITALS — BP 118/80 | HR 89 | Temp 98.5°F | Ht 61.0 in | Wt 142.0 lb

## 2021-12-22 DIAGNOSIS — Z0001 Encounter for general adult medical examination with abnormal findings: Secondary | ICD-10-CM | POA: Diagnosis not present

## 2021-12-22 DIAGNOSIS — L308 Other specified dermatitis: Secondary | ICD-10-CM

## 2021-12-22 DIAGNOSIS — F17200 Nicotine dependence, unspecified, uncomplicated: Secondary | ICD-10-CM

## 2021-12-22 DIAGNOSIS — Z91018 Allergy to other foods: Secondary | ICD-10-CM | POA: Diagnosis not present

## 2021-12-22 MED ORDER — BETAMETHASONE VALERATE 0.1 % EX OINT
TOPICAL_OINTMENT | Freq: Two times a day (BID) | CUTANEOUS | 1 refills | Status: DC
  Filled 2021-12-22 – 2022-01-07 (×2): qty 45, 30d supply, fill #0
  Filled 2022-04-29: qty 45, 30d supply, fill #1

## 2021-12-22 MED ORDER — BUPROPION HCL ER (XL) 150 MG PO TB24
150.0000 mg | ORAL_TABLET | Freq: Every day | ORAL | 1 refills | Status: DC
  Filled 2021-12-22 – 2022-01-07 (×2): qty 30, 30d supply, fill #0
  Filled 2022-09-28: qty 30, 30d supply, fill #1

## 2021-12-22 NOTE — Progress Notes (Signed)
   Subjective:   Patient ID: Monica Mclaughlin, female    DOB: [birth year], 31 y.o.   MRN: [id number]  HPI The patient is here for physical.  PMH, [facility], social history reviewed and updated  Review of Systems  Constitutional: Negative.   HENT: Negative.    Eyes: Negative.   Respiratory:  Negative for cough, chest tightness and shortness of breath.   Cardiovascular:  Negative for chest pain, palpitations and leg swelling.  Gastrointestinal:  Negative for abdominal distention, abdominal pain, constipation, diarrhea, nausea and vomiting.  Musculoskeletal: Negative.   Skin: Negative.   Neurological: Negative.   Psychiatric/Behavioral: Negative.      Objective:  Physical Exam Constitutional:      Appearance: She is well-developed.  HENT:     Head: Normocephalic and atraumatic.  Cardiovascular:     Rate and Rhythm: Normal rate and regular rhythm.  Pulmonary:     Effort: Pulmonary effort is normal. No respiratory distress.     Breath sounds: Normal breath sounds. No wheezing or rales.  Abdominal:     General: Bowel sounds are normal. There is no distension.     Palpations: Abdomen is soft.     Tenderness: There is no abdominal tenderness. There is no rebound.  Musculoskeletal:     Cervical back: Normal range of motion.  Skin:    General: Skin is warm and dry.  Neurological:     Mental Status: She is alert and oriented to person, place, and time.     Coordination: Coordination normal.     Vitals:   12/22/21 1338  BP: 118/80  Pulse: 89  Temp: 98.5 F (36.9 C)  TempSrc: Oral  SpO2: 97%  Weight: 142 lb (64.4 kg)  Height: 5\' 1"  (1.549 m)    Assessment & Plan:

## 2021-12-22 NOTE — Assessment & Plan Note (Signed)
Refilled betamethasone which she uses prn.

## 2021-12-22 NOTE — Patient Instructions (Addendum)
We have sent in wellbutrin to help you stop smoking.  We have ordered the food allergy testing to come do in January or February.

## 2021-12-22 NOTE — Assessment & Plan Note (Signed)
Flu shot up to date. Tetanus up to date. Pap smear up to date. Counseled about sun safety and mole surveillance. Counseled about the dangers of distracted driving. Given 10 year screening recommendations.   

## 2021-12-22 NOTE — Assessment & Plan Note (Signed)
Recent reaction with possible food hives. Checking food allergy panel in 1-2 months as she still has hives.

## 2021-12-22 NOTE — Assessment & Plan Note (Signed)
Rx wellbutrin and counseled. She is interested in quitting and willing to try. Counseled about risk/harm from smoking.

## 2021-12-23 ENCOUNTER — Other Ambulatory Visit (HOSPITAL_COMMUNITY): Payer: Self-pay

## 2021-12-31 ENCOUNTER — Other Ambulatory Visit (HOSPITAL_COMMUNITY): Payer: Self-pay

## 2022-01-07 ENCOUNTER — Other Ambulatory Visit (HOSPITAL_COMMUNITY): Payer: Self-pay

## 2022-02-13 ENCOUNTER — Ambulatory Visit
Admission: RE | Admit: 2022-02-13 | Discharge: 2022-02-13 | Disposition: A | Discharge status: Home / Self Care | Payer: 59 | Source: Ambulatory Visit | Attending: Internal Medicine | Admitting: Internal Medicine

## 2022-02-13 VITALS — BP 124/61 | HR 90 | Temp 98.4°F | Resp 16

## 2022-02-13 DIAGNOSIS — R300 Dysuria: Secondary | ICD-10-CM | POA: Diagnosis not present

## 2022-02-13 DIAGNOSIS — Z113 Encounter for screening for infections with a predominantly sexual mode of transmission: Secondary | ICD-10-CM | POA: Insufficient documentation

## 2022-02-13 DIAGNOSIS — N898 Other specified noninflammatory disorders of vagina: Secondary | ICD-10-CM | POA: Diagnosis not present

## 2022-02-13 DIAGNOSIS — Z3202 Encounter for pregnancy test, result negative: Secondary | ICD-10-CM | POA: Diagnosis present

## 2022-02-13 LAB — POCT URINALYSIS DIP (MANUAL ENTRY)
Bilirubin, UA: NEGATIVE
Glucose, UA: NEGATIVE mg/dL
Ketones, POC UA: NEGATIVE mg/dL — AB
Nitrite, UA: NEGATIVE
Protein Ur, POC: NEGATIVE mg/dL
Spec Grav, UA: >=1.03 — AB (ref 1.010–1.025)
Urobilinogen, UA: 0.2 E.U./dL
pH, UA: 6 (ref 5.0–8.0)

## 2022-02-13 LAB — POCT URINE PREGNANCY: Preg Test, Ur: NEGATIVE

## 2022-02-13 NOTE — ED Provider Notes (Signed)
[facility]    CSN: [id number] Arrival date & time: 02/13/22  1817      History   Chief Complaint Chief Complaint  Patient presents with   Vaginal Discharge    UTI/yeast/STI - Entered by patient    HPI Monica Mclaughlin is a 32 y.o. female.   Patient presents with urinary burning, lower back pain, mild lower "bladder discomfort", white vaginal discharge that started about 5 days ago.  Denies hematuria, abnormal vaginal bleeding, pelvic pain, fever.  Reports only mild urinary burning.  Denies exposure to STD.  Reports protected intercourse on 02/01/2022 but the condom broke.  Denies history of recurrent bacterial vaginosis.  Last menstrual cycle was 01/24/2022.   Vaginal Discharge   Past Medical History:  Diagnosis Date   Abnormal Pap smear 2011   ASCUS (atypical squamous cells of undetermined significance) on Pap smear    ASCUS with positive high risk HPV 06/2006   Candidiasis    Dysplasia of cervix, low grade (CIN 1) 2008   Dysplasia of cervix, low grade (CIN 1)    Eczema    H/O dysmenorrhea    Monilial vulvovaginitis 08/2005   Sickle cell trait ([place])    Vulvovaginitis     Patient Active Problem List   Diagnosis Date Noted   Food allergy 12/22/2021   Vitamin D deficiency 08/30/2020   Encounter for general adult medical examination with abnormal findings 08/30/2020   Toenail fungus 08/13/2020   Tonsil stone 07/04/2019   Eczema 01/10/2018   Esophageal reflux 05/14/2015   Hemorrhoids 10/10/2014   Irregular heart beats 09/10/2011   Smoker 06/18/2011    Past Surgical History:  Procedure Laterality Date   WISDOM TOOTH EXTRACTION      OB History     Gravida  1   Para  1   Term  1   Preterm  0   AB  0   Living  1      SAB  0   IAB  0   Ectopic  0   Multiple  0   Live Births  1            Home Medications    Prior to Admission medications   Medication Sig Start Date End Date Taking? Authorizing Provider  betamethasone  valerate ointment (VALISONE) 0.1 % Apply topically 2 times daily 12/22/21   Hoyt Koch, MD  buPROPion (WELLBUTRIN XL) 150 MG 24 hr tablet Take 1 tablet (150 mg total) by mouth daily. 12/22/21   Hoyt Koch, MD    Family History Family History  Problem Relation Age of Onset   Hypertension Mother    Asthma Mother    Alcohol abuse Father    Hypertension Maternal Aunt    Hypertension Maternal Grandmother    Diabetes Maternal Grandfather    Cancer Maternal Grandfather     Social History Social History   Tobacco Use   Smoking status: Every Day    Packs/day: 1.00    Years: 15.00    Total pack years: 15.00    Types: Cigarettes    Start date: 2006   Smokeless tobacco: Never  Substance Use Topics   Alcohol use: No   Drug use: No     Allergies   Betadine [povidone iodine], Shrimp extract allergy skin test, and Shellfish allergy   Review of Systems Review of Systems Per HPI  Physical Exam Triage Vital Signs ED Triage Vitals [02/13/22 1826]  Enc Vitals Group  BP 124/61     Pulse Rate (!) 106     Resp 16     Temp 98.4 F (36.9 C)     Temp Source Oral     SpO2 98 %     Weight      Height      Head Circumference      Peak Flow      Pain Score 4     Pain Loc      Pain Edu?      Excl. in [place]?    No data found.  Updated Vital Signs BP 124/61 (BP Location: Left Arm)   Pulse 90   Temp 98.4 F (36.9 C) (Oral)   Resp 16   LMP 01/29/2022   SpO2 98%   Visual Acuity Right Eye Distance:   Left Eye Distance:   Bilateral Distance:    Right Eye Near:   Left Eye Near:    Bilateral Near:     Physical Exam Constitutional:      General: She is not in acute distress.    Appearance: Normal appearance. She is not toxic-appearing or diaphoretic.  HENT:     Head: Normocephalic and atraumatic.  Eyes:     Extraocular Movements: Extraocular movements intact.     Conjunctiva/sclera: Conjunctivae normal.  Cardiovascular:     Rate and Rhythm:  Normal rate and regular rhythm.     Pulses: Normal pulses.     Heart sounds: Normal heart sounds.  Pulmonary:     Effort: Pulmonary effort is normal. No respiratory distress.     Breath sounds: Normal breath sounds.  Abdominal:     General: Bowel sounds are normal. There is no distension.     Palpations: Abdomen is soft.     Tenderness: There is no abdominal tenderness.  Genitourinary:    Comments: Deferred with shared decision making.  Self swab performed. Neurological:     General: No focal deficit present.     Mental Status: She is alert and oriented to person, place, and time. Mental status is at baseline.  Psychiatric:        Mood and Affect: Mood normal.        Behavior: Behavior normal.        Thought Content: Thought content normal.        Judgment: Judgment normal.      UC Treatments / Results  Labs (all labs ordered are listed, but only abnormal results are displayed) Labs Reviewed  POCT URINALYSIS DIP (MANUAL ENTRY) - Abnormal; Notable for the following components:      Result Value   Ketones, POC UA negative (*)    Spec Grav, UA >=1.030 (*)    Blood, UA trace-intact (*)    Leukocytes, UA Trace (*)    All other components within normal limits  URINE CULTURE  POCT URINE PREGNANCY  CERVICOVAGINAL ANCILLARY ONLY    EKG   Radiology No results found.  Procedures Procedures (including critical care time)  Medications Ordered in UC Medications - No data to display  Initial Impression / Assessment and Plan / UC Course  I have reviewed the triage vital signs and the nursing notes.  Pertinent labs & imaging results that were available during my care of the patient were reviewed by me and considered in my medical decision making (see chart for details).     UA only showing trace leukocytes which is not definitive for UTI. Patient's dysuria is also most likely due to vaginitis.  Given patient is also having vaginal discharge, I am suspicious that vaginitis is  the cause of patient's urinary symptoms and vaginal discharge.  Therefore, will await urine culture and vaginal swab for further treatment.  Given no exposure to STD, I do think it is okay to await results.  Patient to refrain from sexual activity until test results and treatment are complete.  Discussed return precautions.  Patient verbalized understanding and was agreeable with plan. Final Clinical Impressions(s) / UC Diagnoses   Final diagnoses:  Vaginal discharge  Dysuria  Screening examination for venereal disease  Urine pregnancy test negative     Discharge Instructions      Pregnancy test is negative.  Urine culture and vaginal swab are pending.  Will call if they are positive and treat as appropriate.  Please refrain from sexual activity until test results and treatment are complete.  Follow-up if any symptoms persist or worsen.     ED Prescriptions   None    PDMP not reviewed this encounter.   Teodora Medici, [place] 02/13/22 [phone]

## 2022-02-13 NOTE — ED Triage Notes (Signed)
Patient presents to [facility] for urinary urgency, back pain, abdominal pain, vaginal discharge since Monday. Hx of BV. Took a dose of probiotic.

## 2022-02-13 NOTE — Discharge Instructions (Signed)
Pregnancy test is negative.  Urine culture and vaginal swab are pending.  Will call if they are positive and treat as appropriate.  Please refrain from sexual activity until test results and treatment are complete.  Follow-up if any symptoms persist or worsen.

## 2022-02-14 LAB — URINE CULTURE: Culture: NO GROWTH

## 2022-02-16 LAB — CERVICOVAGINAL ANCILLARY ONLY
Bacterial Vaginitis (gardnerella): POSITIVE — AB
Candida Glabrata: NEGATIVE
Candida Vaginitis: NEGATIVE
Chlamydia: NEGATIVE
Comment: NEGATIVE
Comment: NEGATIVE
Comment: NEGATIVE
Comment: NEGATIVE
Comment: NEGATIVE
Comment: NORMAL
Neisseria Gonorrhea: NEGATIVE
Trichomonas: NEGATIVE

## 2022-02-17 ENCOUNTER — Telehealth (HOSPITAL_COMMUNITY): Payer: Self-pay | Admitting: Emergency Medicine

## 2022-02-17 MED ORDER — METRONIDAZOLE 500 MG PO TABS: 500.0000 mg | ORAL_TABLET | Freq: Two times a day (BID) | ORAL | 0 refills | Status: DC

## 2022-04-12 IMAGING — US US THYROID
1 series · 14 of 25 positions shown · non-contrast
Comparison: None.

CLINICAL DATA: Other.  Dysphagia

EXAM:
THYROID ULTRASOUND
TECHNIQUE: Ultrasound examination of the thyroid gland and adjacent soft
tissues was performed.

[Series 1: us thyroid · 0.07mm/px · 14 of 34 slices shown]
[im 1/34]
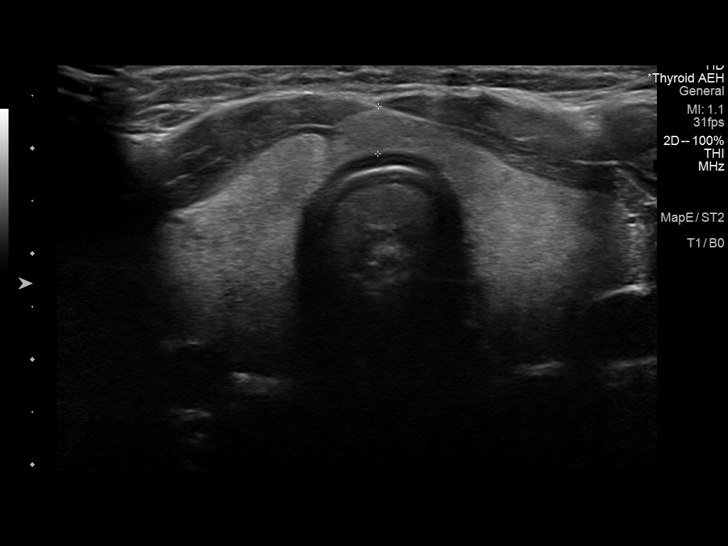
[im 3/34]
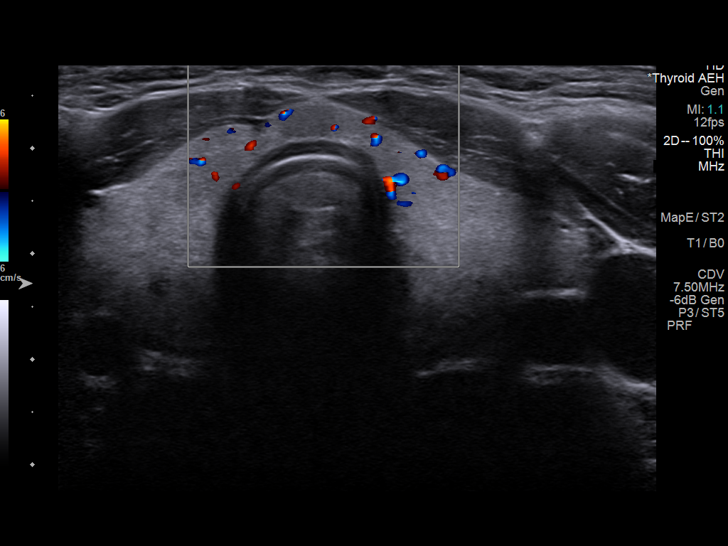
[im 6/34]
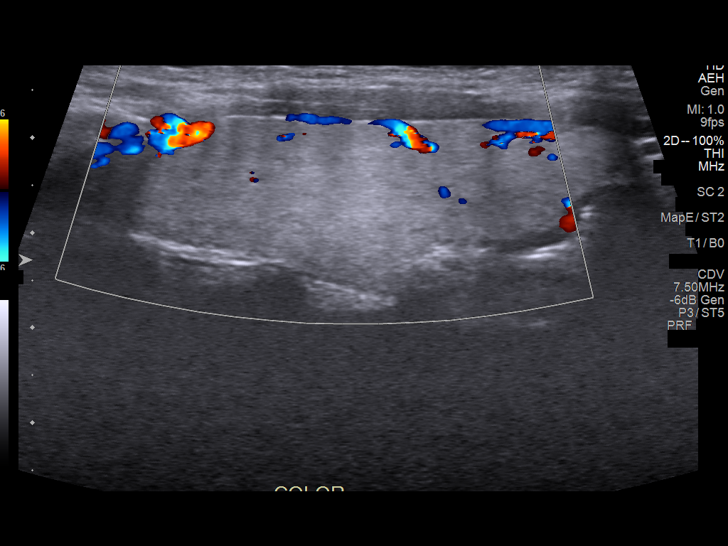
[im 9/34]
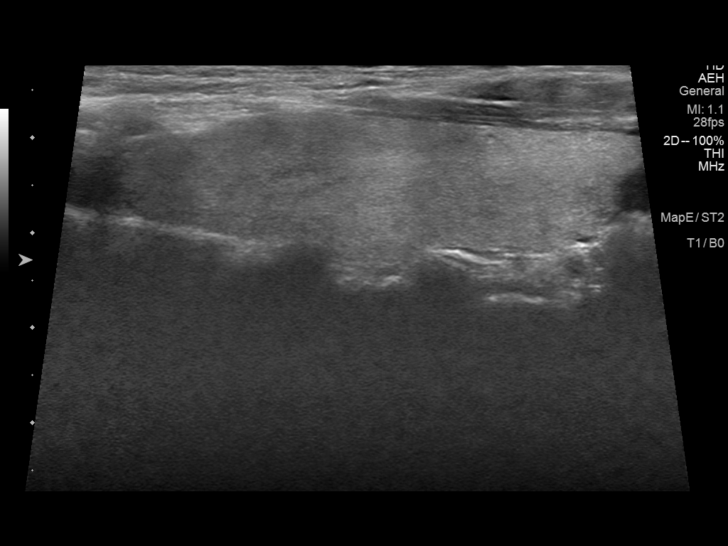
[im 12/34]
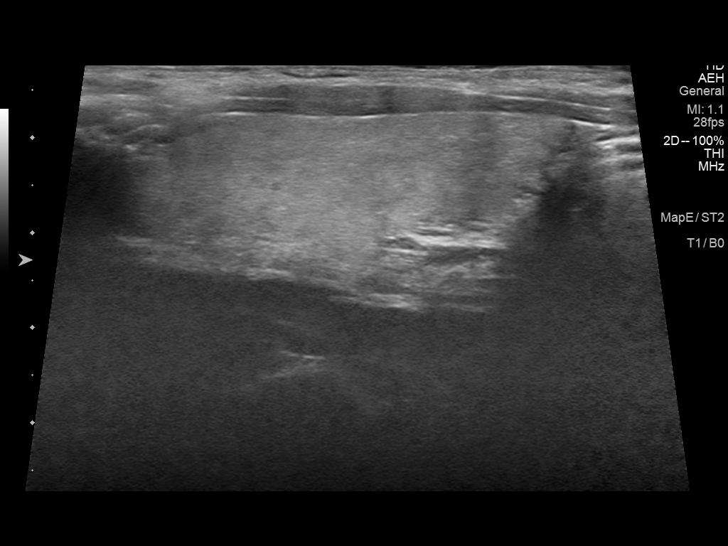
[im 13/34]
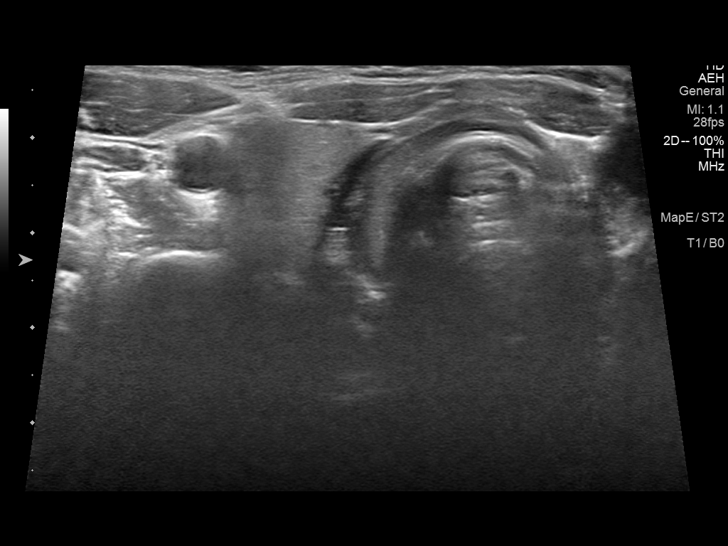
[im 16/34]
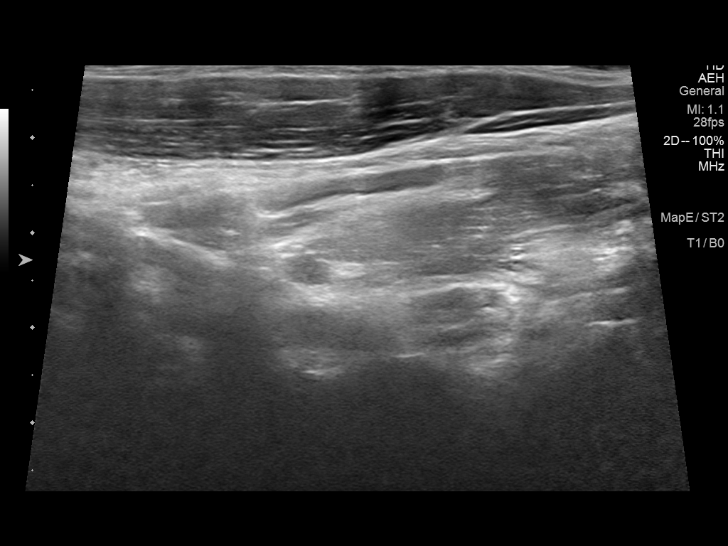
[im 18/34]
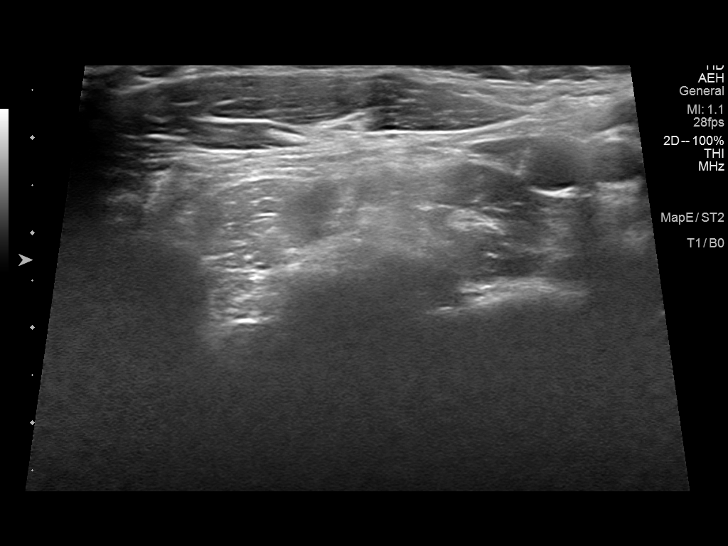
[im 21/34]
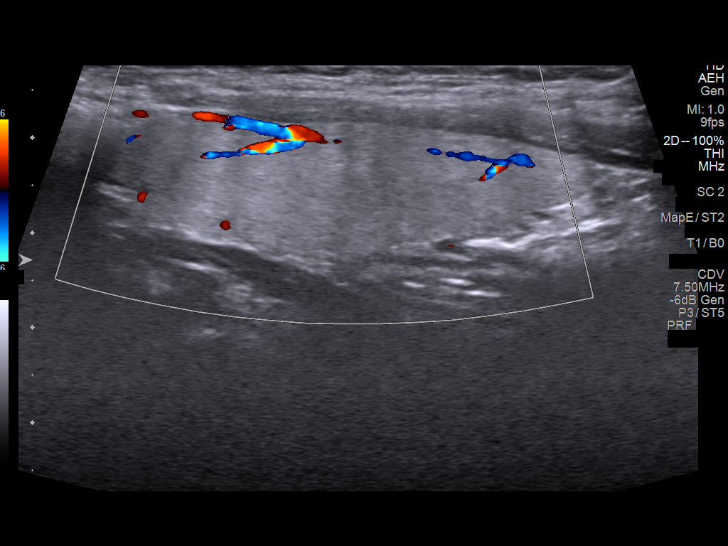
[im 23/34]
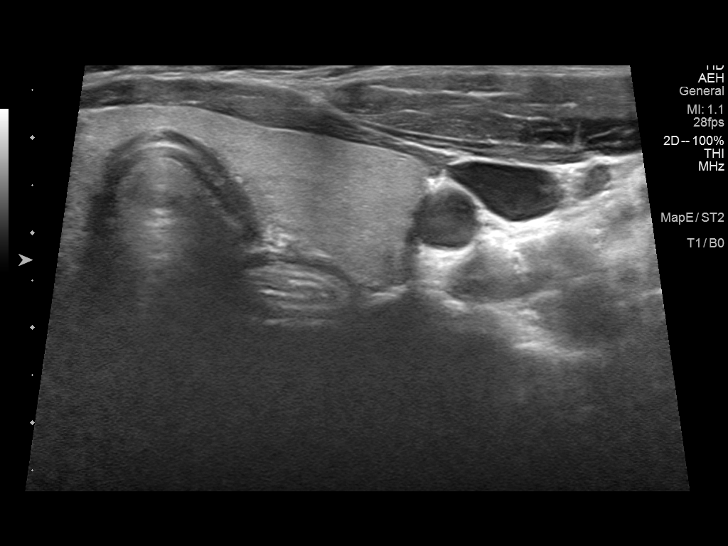
[im 25/34]
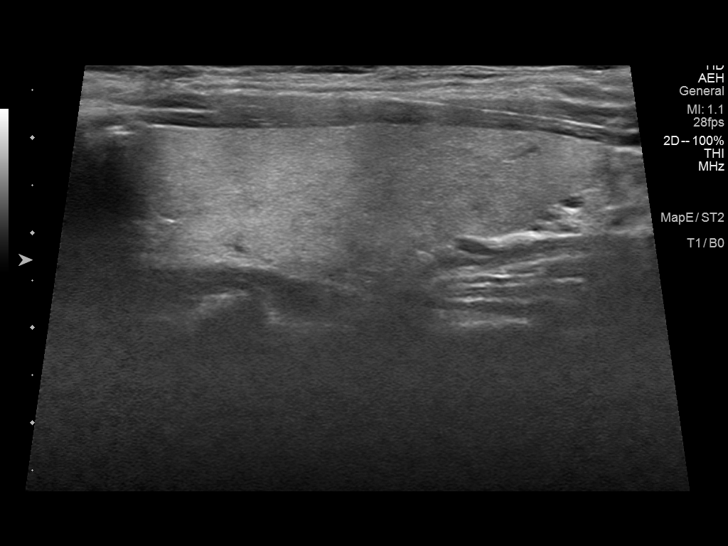
[im 28/34]
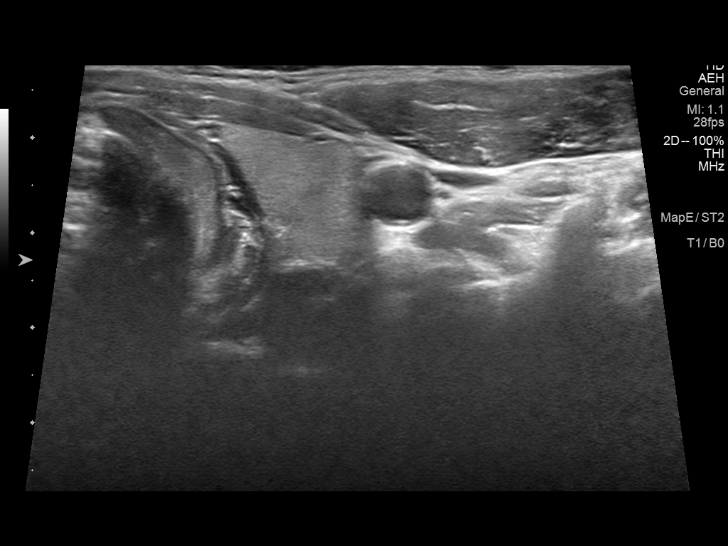
[im 31/34]
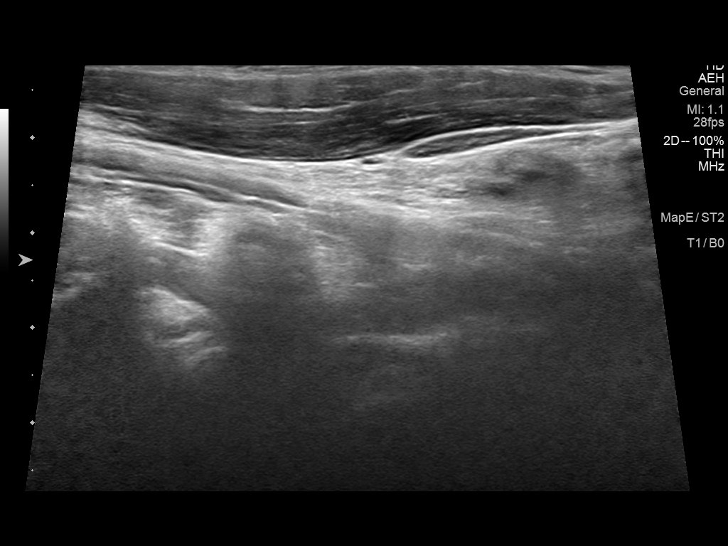
[im 34/34]
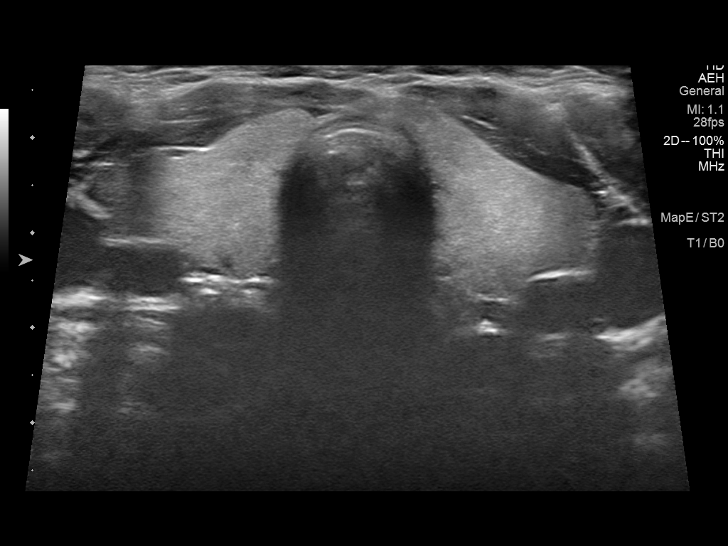

[14 of 25 positions shown; findings below may reference images not displayed]

FINDINGS: Parenchymal Echotexture: Mildly heterogenous

Isthmus: 0.5 cm

Right lobe: 5.8 x 1.6 x 2.0 cm

Left lobe: 5.3 x 1.5 x 2.0 cm

_________________________________________________________

Estimated total number of nodules >/= 1 cm: 0

Number of spongiform nodules >/=  2 cm not described below (TR1): 0

Number of mixed cystic and solid nodules >/= 1.5 cm not described
below (TR2): 0

_________________________________________________________

No discrete nodules are seen within the thyroid gland.
IMPRESSION: Mildly heterogeneous and enlarged thyroid gland without evidence of
discrete thyroid nodule.

## 2022-04-29 ENCOUNTER — Other Ambulatory Visit: Payer: Self-pay

## 2022-04-29 ENCOUNTER — Other Ambulatory Visit (HOSPITAL_COMMUNITY): Payer: Self-pay

## 2022-07-26 ENCOUNTER — Ambulatory Visit
Admission: EM | Admit: 2022-07-26 | Discharge: 2022-07-26 | Disposition: A | Discharge status: Home / Self Care | Payer: 59 | Attending: Physician Assistant | Admitting: Physician Assistant

## 2022-07-26 DIAGNOSIS — N898 Other specified noninflammatory disorders of vagina: Secondary | ICD-10-CM | POA: Diagnosis present

## 2022-07-26 DIAGNOSIS — Z113 Encounter for screening for infections with a predominantly sexual mode of transmission: Secondary | ICD-10-CM | POA: Diagnosis present

## 2022-07-26 LAB — POCT URINALYSIS DIP (MANUAL ENTRY)
Bilirubin, UA: NEGATIVE
Blood, UA: NEGATIVE
Glucose, UA: NEGATIVE mg/dL
Ketones, POC UA: NEGATIVE mg/dL
Nitrite, UA: NEGATIVE
Spec Grav, UA: 1.02 (ref 1.010–1.025)
Urobilinogen, UA: 4 E.U./dL — AB
pH, UA: 8 (ref 5.0–8.0)

## 2022-07-26 LAB — POCT URINE PREGNANCY: Preg Test, Ur: NEGATIVE

## 2022-07-26 MED ORDER — METRONIDAZOLE 500 MG PO TABS: 500.0000 mg | ORAL_TABLET | Freq: Two times a day (BID) | ORAL | 0 refills | Status: DC

## 2022-07-26 MED ORDER — CIPROFLOXACIN HCL 500 MG PO TABS: 500.0000 mg | ORAL_TABLET | Freq: Two times a day (BID) | ORAL | 0 refills | Status: AC

## 2022-07-26 NOTE — ED Triage Notes (Signed)
"  Having some Vaginal discharge". "I think it is BV". "Discharge is grey/milky color over the last few days with odor". This is recurrent (to discuss). Possible concern for STI "needs testing". No dysuria.

## 2022-07-26 NOTE — Discharge Instructions (Signed)
Follow up with any further concerns.  

## 2022-07-26 NOTE — ED Provider Notes (Signed)
[facility]    CSN: [id number] Arrival date & time: 07/26/22  4098      History   Chief Complaint Chief Complaint  Patient presents with   Vaginal Problem    HPI Monica Mclaughlin is a 32 y.o. female.   Patient here today for evaluation of vaginal discharge.  She reports that she has a green milky discharge with associated odor that she believes is likely BV as she has had this in the past.  She would like STD screening as well.  She denies any dysuria.  She has not any abdominal pain, nausea or vomiting.  She has not had fever.  Last known menstrual period was July 09, 2022.  The history is provided by the patient.    Past Medical History:  Diagnosis Date   Abnormal Pap smear 2011   ASCUS (atypical squamous cells of undetermined significance) on Pap smear    ASCUS with positive high risk HPV 06/2006   Candidiasis    Dysplasia of cervix, low grade (CIN 1) 2008   Dysplasia of cervix, low grade (CIN 1)    Eczema    H/O dysmenorrhea    Monilial vulvovaginitis 08/2005   Sickle cell trait (HCC)    Vulvovaginitis     Patient Active Problem List   Diagnosis Date Noted   Food allergy 12/22/2021   Vitamin D deficiency 08/30/2020   Encounter for general adult medical examination with abnormal findings 08/30/2020   Toenail fungus 08/13/2020   Tonsil stone 07/04/2019   Eczema 01/10/2018   Esophageal reflux 05/14/2015   Hemorrhoids 10/10/2014   Irregular heart beats 09/10/2011   Smoker 06/18/2011    Past Surgical History:  Procedure Laterality Date   WISDOM TOOTH EXTRACTION      OB History     Gravida  1   Para  1   Term  1   Preterm  0   AB  0   Living  1      SAB  0   IAB  0   Ectopic  0   Multiple  0   Live Births  1            Home Medications    Prior to Admission medications   Medication Sig Start Date End Date Taking? Authorizing Provider  ciprofloxacin (CIPRO) 500 MG tablet Take 1 tablet (500 mg total) by mouth every 12  (twelve) hours for 5 days. 07/26/22 07/31/22 Yes Tomi Bamberger, PA-C  betamethasone valerate ointment (VALISONE) 0.1 % Apply topically 2 times daily 12/22/21   Myrlene Broker, MD  buPROPion (WELLBUTRIN XL) 150 MG 24 hr tablet Take 1 tablet (150 mg total) by mouth daily. 12/22/21   Myrlene Broker, MD  metroNIDAZOLE (FLAGYL) 500 MG tablet Take 1 tablet (500 mg total) by mouth 2 (two) times daily. 07/26/22   Tomi Bamberger, PA-C    Family History Family History  Problem Relation Age of Onset   Hypertension Mother    Asthma Mother    Alcohol abuse Father    Hypertension Maternal Aunt    Hypertension Maternal Grandmother    Diabetes Maternal Grandfather    Cancer Maternal Grandfather     Social History Social History   Tobacco Use   Smoking status: Every Day    Current packs/day: 1.00    Average packs/day: 1 pack/day for 18.6 years (18.6 ttl pk-yrs)    Types: Cigarettes    Start date: 2006   Smokeless tobacco:  Never  Vaping Use   Vaping status: Never Used  Substance Use Topics   Alcohol use: No   Drug use: No     Allergies   Betadine [povidone iodine], Povidone-iodine, Shrimp extract, and Shellfish allergy   Review of Systems Review of Systems  Constitutional:  Negative for chills and fever.  Eyes:  Negative for discharge and redness.  Respiratory:  Negative for shortness of breath.   Gastrointestinal:  Negative for abdominal pain, nausea and vomiting.  Genitourinary:  Positive for vaginal discharge. Negative for dysuria and menstrual problem.     Physical Exam Triage Vital Signs ED Triage Vitals  Encounter Vitals Group     BP 07/26/22 0954 103/65     Systolic BP Percentile --      Diastolic BP Percentile --      Pulse Rate 07/26/22 0954 96     Resp 07/26/22 0954 16     Temp 07/26/22 0954 99.1 F (37.3 C)     Temp Source 07/26/22 0954 Oral     SpO2 07/26/22 0954 98 %     Weight 07/26/22 0952 150 lb (68 kg)     Height 07/26/22 0952 5\' 1"   (1.549 m)     Head Circumference --      Peak Flow --      Pain Score 07/26/22 0951 0     Pain Loc --      Pain Education --      Exclude from Growth Chart --    No data found.  Updated Vital Signs BP 103/65 (BP Location: Left Arm)   Pulse 96   Temp 99.1 F (37.3 C) (Oral)   Resp 16   Ht 5\' 1"  (1.549 m)   Wt 150 lb (68 kg)   LMP 07/09/2022 (Exact Date)   SpO2 98%   BMI 28.34 kg/m   Physical Exam Vitals and nursing note reviewed.  Constitutional:      General: She is not in acute distress.    Appearance: Normal appearance. She is not ill-appearing.  HENT:     Head: Normocephalic and atraumatic.  Eyes:     Conjunctiva/sclera: Conjunctivae normal.  Cardiovascular:     Rate and Rhythm: Normal rate.  Pulmonary:     Effort: Pulmonary effort is normal. No respiratory distress.  Neurological:     Mental Status: She is alert.  Psychiatric:        Mood and Affect: Mood normal.        Behavior: Behavior normal.        Thought Content: Thought content normal.      UC Treatments / Results  Labs (all labs ordered are listed, but only abnormal results are displayed) Labs Reviewed  POCT URINALYSIS DIP (MANUAL ENTRY) - Abnormal; Notable for the following components:      Result Value   Protein Ur, POC trace (*)    Urobilinogen, UA 4.0 (*)    Leukocytes, UA Large (3+) (*)    All other components within normal limits  URINE CULTURE  RPR  HIV ANTIBODY (ROUTINE TESTING W REFLEX)  HEPATITIS PANEL, ACUTE  POCT URINE PREGNANCY  CERVICOVAGINAL ANCILLARY ONLY    EKG   Radiology No results found.  Procedures Procedures (including critical care time)  Medications Ordered in UC Medications - No data to display  Initial Impression / Assessment and Plan / UC Course  I have reviewed the triage vital signs and the nursing notes.  Pertinent labs & imaging results that were available  during my care of the patient were reviewed by me and considered in my medical decision  making (see chart for details).    Given leukocytes on UA we will treat with Cipro to cover possible UTI.  Urine culture ordered.  Metronidazole prescribed to cover BV and STD screening ordered.  Will await results for further recommendation.  Discussed that pregnancy test was negative in office but recommended rescreening if menstrual period does not begin as expected.  Encouraged follow-up with any further concerns.  Final Clinical Impressions(s) / UC Diagnoses   Final diagnoses:  Vaginal discharge  Screening for STD (sexually transmitted disease)     Discharge Instructions      Follow up with any further concerns.    ED Prescriptions     Medication Sig Dispense Auth. Provider   metroNIDAZOLE (FLAGYL) 500 MG tablet Take 1 tablet (500 mg total) by mouth 2 (two) times daily. 14 tablet Erma Pinto F, PA-C   ciprofloxacin (CIPRO) 500 MG tablet Take 1 tablet (500 mg total) by mouth every 12 (twelve) hours for 5 days. 10 tablet Tomi Bamberger, PA-C      PDMP not reviewed this encounter.   Tomi Bamberger, PA-C 07/26/22 1054

## 2022-07-27 LAB — CERVICOVAGINAL ANCILLARY ONLY
Bacterial Vaginitis (gardnerella): POSITIVE — AB
Candida Glabrata: NEGATIVE
Candida Vaginitis: NEGATIVE
Chlamydia: NEGATIVE
Comment: NEGATIVE
Comment: NEGATIVE
Comment: NEGATIVE
Comment: NEGATIVE
Comment: NEGATIVE
Comment: NORMAL
Neisseria Gonorrhea: NEGATIVE
Trichomonas: POSITIVE — AB

## 2022-07-27 LAB — URINE CULTURE: Culture: <10000 — AB

## 2022-07-28 LAB — RPR: RPR Ser Ql: NONREACTIVE

## 2022-07-28 LAB — HIV ANTIBODY (ROUTINE TESTING W REFLEX): HIV Screen 4th Generation wRfx: NONREACTIVE

## 2022-08-10 ENCOUNTER — Encounter: Payer: Self-pay | Admitting: Internal Medicine

## 2022-08-10 ENCOUNTER — Ambulatory Visit: Payer: 59 | Admitting: Internal Medicine

## 2022-08-10 VITALS — BP 120/60 | HR 75 | Temp 98.3°F | Ht 61.0 in | Wt 148.0 lb

## 2022-08-10 DIAGNOSIS — Z23 Encounter for immunization: Secondary | ICD-10-CM

## 2022-08-10 DIAGNOSIS — E559 Vitamin D deficiency, unspecified: Secondary | ICD-10-CM | POA: Diagnosis not present

## 2022-08-10 DIAGNOSIS — Z13220 Encounter for screening for lipoid disorders: Secondary | ICD-10-CM

## 2022-08-10 DIAGNOSIS — Z0000 Encounter for general adult medical examination without abnormal findings: Secondary | ICD-10-CM

## 2022-08-10 DIAGNOSIS — R5383 Other fatigue: Secondary | ICD-10-CM | POA: Insufficient documentation

## 2022-08-10 LAB — COMPREHENSIVE METABOLIC PANEL
ALT: 16 U/L (ref 0–35)
AST: 23 U/L (ref 0–37)
Albumin: 3.9 g/dL (ref 3.5–5.2)
Alkaline Phosphatase: 45 U/L (ref 39–117)
BUN: 7 mg/dL (ref 6–23)
CO2: 25 mEq/L (ref 19–32)
Calcium: 9.2 mg/dL (ref 8.4–10.5)
Chloride: 107 mEq/L (ref 96–112)
Creatinine, Ser: 0.92 mg/dL (ref 0.40–1.20)
GFR: 81.52 mL/min (ref >60.00)
Glucose, Bld: 75 mg/dL (ref 70–99)
Potassium: 3.9 mEq/L (ref 3.5–5.1)
Sodium: 140 mEq/L (ref 135–145)
Total Bilirubin: 0.5 mg/dL (ref 0.2–1.2)
Total Protein: 6.4 g/dL (ref 6.0–8.3)

## 2022-08-10 LAB — CBC
HCT: 37.3 % (ref 36.0–46.0)
Hemoglobin: 12.1 g/dL (ref 12.0–15.0)
MCHC: 32.5 g/dL (ref 30.0–36.0)
MCV: 84.6 fl (ref 78.0–100.0)
Platelets: 221 10*3/uL (ref 150.0–400.0)
RBC: 4.4 Mil/uL (ref 3.87–5.11)
RDW: 14.4 % (ref 11.5–15.5)
WBC: 8.1 10*3/uL (ref 4.0–10.5)

## 2022-08-10 LAB — LIPID PANEL
Cholesterol: 150 mg/dL (ref 0–200)
HDL: 55.3 mg/dL (ref >39.00)
LDL Cholesterol: 85 mg/dL (ref 0–99)
NonHDL: 95.12
Total CHOL/HDL Ratio: 3
Triglycerides: 50 mg/dL (ref 0.0–149.0)
VLDL: 10 mg/dL (ref 0.0–40.0)

## 2022-08-10 LAB — VITAMIN B12: Vitamin B-12: 262 pg/mL (ref 211–911)

## 2022-08-10 LAB — TSH: TSH: 0.54 u[IU]/mL (ref 0.35–5.50)

## 2022-08-10 LAB — VITAMIN D 25 HYDROXY (VIT D DEFICIENCY, FRACTURES): VITD: 13.35 ng/mL — ABNORMAL LOW (ref 30.00–100.00)

## 2022-08-10 NOTE — Assessment & Plan Note (Signed)
Checking CBC, CMP, vitamin D and B12, TSH. Treat as appropriate.

## 2022-08-10 NOTE — Progress Notes (Signed)
   Subjective:   Patient ID: Monica Mclaughlin, female    DOB: [birth year], 32 y.o.   MRN: [id number]  HPI The patient is a 32 YO female coming in for new fatigue.   Review of Systems  Constitutional:  Positive for fatigue.  HENT: Negative.    Eyes: Negative.   Respiratory:  Negative for cough, chest tightness and shortness of breath.   Cardiovascular:  Negative for chest pain, palpitations and leg swelling.  Gastrointestinal:  Negative for abdominal distention, abdominal pain, constipation, diarrhea, nausea and vomiting.  Musculoskeletal: Negative.   Skin: Negative.   Neurological: Negative.   Psychiatric/Behavioral: Negative.      Objective:  Physical Exam Constitutional:      Appearance: She is well-developed.  HENT:     Head: Normocephalic and atraumatic.  Cardiovascular:     Rate and Rhythm: Normal rate and regular rhythm.  Pulmonary:     Effort: Pulmonary effort is normal. No respiratory distress.     Breath sounds: Normal breath sounds. No wheezing or rales.  Abdominal:     General: Bowel sounds are normal. There is no distension.     Palpations: Abdomen is soft.     Tenderness: There is no abdominal tenderness. There is no rebound.  Musculoskeletal:     Cervical back: Normal range of motion.  Skin:    General: Skin is warm and dry.  Neurological:     Mental Status: She is alert and oriented to person, place, and time.     Coordination: Coordination normal.     Vitals:   08/10/22 1348  BP: 120/60  Pulse: 75  Temp: 98.3 F (36.8 C)  TempSrc: Oral  SpO2: 99%  Weight: 148 lb (67.1 kg)  Height: 5\' 1"  (1.549 m)    Assessment & Plan:  Tdap given at visit

## 2022-08-10 NOTE — Assessment & Plan Note (Signed)
Checking vitamin d and treat as appropriate. New fatigue.

## 2022-08-11 ENCOUNTER — Other Ambulatory Visit: Payer: Self-pay | Admitting: Internal Medicine

## 2022-08-11 MED ORDER — VITAMIN D (ERGOCALCIFEROL) 1.25 MG (50000 UNIT) PO CAPS: 50000.0000 [IU] | ORAL_CAPSULE | ORAL | 0 refills | Status: DC

## 2022-09-28 ENCOUNTER — Other Ambulatory Visit: Payer: Self-pay | Admitting: Internal Medicine

## 2022-09-28 ENCOUNTER — Encounter (HOSPITAL_COMMUNITY): Payer: Self-pay

## 2022-09-28 ENCOUNTER — Other Ambulatory Visit (HOSPITAL_COMMUNITY): Payer: Self-pay

## 2022-09-28 MED ORDER — BETAMETHASONE VALERATE 0.1 % EX OINT
TOPICAL_OINTMENT | Freq: Two times a day (BID) | CUTANEOUS | 1 refills | Status: DC
  Filled 2022-09-28: qty 45, 30d supply, fill #0

## 2022-09-29 ENCOUNTER — Other Ambulatory Visit (HOSPITAL_COMMUNITY): Payer: Self-pay

## 2022-09-30 ENCOUNTER — Other Ambulatory Visit (HOSPITAL_COMMUNITY): Payer: Self-pay

## 2022-12-09 ENCOUNTER — Encounter (HOSPITAL_COMMUNITY): Payer: Self-pay

## 2022-12-09 ENCOUNTER — Other Ambulatory Visit (HOSPITAL_COMMUNITY): Payer: Self-pay

## 2022-12-09 MED ORDER — PRENATAL VITAMINS 28-0.8 MG PO TABS
1.0000 | ORAL_TABLET | Freq: Every day | ORAL | 3 refills | Status: DC
  Filled 2022-12-09: qty 100, 100d supply, fill #0

## 2022-12-09 MED ORDER — AZO BORIC ACID 600 MG VA SUPP
600.0000 mg | Freq: Every evening | VAGINAL | 1 refills | Status: AC
  Filled 2022-12-09: qty 30, 30d supply, fill #0

## 2022-12-09 MED ORDER — METRONIDAZOLE 0.75 % VA GEL
1.0000 | Freq: Every day | VAGINAL | 1 refills | Status: DC
  Filled 2022-12-09: qty 70, 5d supply, fill #0

## 2022-12-09 MED ORDER — METRONIDAZOLE 500 MG PO TABS
500.0000 mg | ORAL_TABLET | Freq: Two times a day (BID) | ORAL | 0 refills | Status: DC
  Filled 2022-12-09: qty 14, 7d supply, fill #0

## 2022-12-10 ENCOUNTER — Other Ambulatory Visit (HOSPITAL_COMMUNITY): Payer: Self-pay

## 2023-01-04 ENCOUNTER — Encounter: Payer: Self-pay | Admitting: Internal Medicine

## 2023-01-04 ENCOUNTER — Ambulatory Visit (INDEPENDENT_AMBULATORY_CARE_PROVIDER_SITE_OTHER): Payer: 59 | Admitting: Internal Medicine

## 2023-01-04 VITALS — BP 98/68 | HR 78 | Temp 98.7°F | Ht 61.0 in | Wt 140.0 lb

## 2023-01-04 DIAGNOSIS — F17200 Nicotine dependence, unspecified, uncomplicated: Secondary | ICD-10-CM | POA: Diagnosis not present

## 2023-01-04 DIAGNOSIS — Z0001 Encounter for general adult medical examination with abnormal findings: Secondary | ICD-10-CM

## 2023-01-04 DIAGNOSIS — K649 Unspecified hemorrhoids: Secondary | ICD-10-CM

## 2023-01-04 DIAGNOSIS — Z0000 Encounter for general adult medical examination without abnormal findings: Secondary | ICD-10-CM | POA: Diagnosis not present

## 2023-01-04 DIAGNOSIS — E559 Vitamin D deficiency, unspecified: Secondary | ICD-10-CM | POA: Diagnosis not present

## 2023-01-04 DIAGNOSIS — L308 Other specified dermatitis: Secondary | ICD-10-CM | POA: Diagnosis not present

## 2023-01-04 LAB — VITAMIN D 25 HYDROXY (VIT D DEFICIENCY, FRACTURES): VITD: 28.53 ng/mL — ABNORMAL LOW (ref 30.00–100.00)

## 2023-01-04 MED ORDER — BUPROPION HCL ER (XL) 150 MG PO TB24: 150.0000 mg | ORAL_TABLET | Freq: Every day | ORAL | 3 refills | Status: DC

## 2023-01-04 MED ORDER — BETAMETHASONE VALERATE 0.1 % EX OINT: TOPICAL_OINTMENT | Freq: Two times a day (BID) | CUTANEOUS | 1 refills | Status: DC

## 2023-01-05 NOTE — Assessment & Plan Note (Signed)
Wants to stop in the new year and we discussed several strategies to help with quitting. On wellbutrin 150 mg daily and will continue.

## 2023-01-05 NOTE — Assessment & Plan Note (Signed)
Needs refill on ointment which is done today. Winter is worst for her eczema.

## 2023-01-05 NOTE — Assessment & Plan Note (Signed)
Checking vitamin D after treatment and adjust as needed.

## 2023-01-05 NOTE — Assessment & Plan Note (Signed)
Flu shot up to date. Tetanus up to date. Pap smear up to date with gyn. Counseled about sun safety and mole surveillance. Counseled about the dangers of distracted driving. Given 10 year screening recommendations.   

## 2023-01-29 ENCOUNTER — Other Ambulatory Visit: Payer: Self-pay | Admitting: Internal Medicine

## 2023-01-31 ENCOUNTER — Encounter: Payer: Self-pay | Admitting: Emergency Medicine

## 2023-01-31 ENCOUNTER — Ambulatory Visit
Admission: EM | Admit: 2023-01-31 | Discharge: 2023-01-31 | Disposition: A | Discharge status: Home / Self Care | Payer: 59 | Attending: Physician Assistant | Admitting: Physician Assistant

## 2023-01-31 ENCOUNTER — Telehealth: Payer: Self-pay | Admitting: Emergency Medicine

## 2023-01-31 DIAGNOSIS — H5789 Other specified disorders of eye and adnexa: Secondary | ICD-10-CM | POA: Insufficient documentation

## 2023-01-31 DIAGNOSIS — Z113 Encounter for screening for infections with a predominantly sexual mode of transmission: Secondary | ICD-10-CM | POA: Insufficient documentation

## 2023-01-31 MED ORDER — POLYMYXIN B-TRIMETHOPRIM 10000-0.1 UNIT/ML-% OP SOLN: 1.0000 [drp] | OPHTHALMIC | 0 refills | Status: DC

## 2023-01-31 NOTE — ED Triage Notes (Signed)
Pt presents today with eye irritation. Pt states she wiped hair products in her eyes by mistake. The incident caused burning and cloudiness in her vision.   Pt would like STI testing.

## 2023-01-31 NOTE — Telephone Encounter (Signed)
Called pt to triage. Pt had already returned to building.

## 2023-02-01 LAB — CERVICOVAGINAL ANCILLARY ONLY
Bacterial Vaginitis (gardnerella): POSITIVE — AB
Candida Glabrata: NEGATIVE
Candida Vaginitis: NEGATIVE
Chlamydia: NEGATIVE
Comment: NEGATIVE
Comment: NEGATIVE
Comment: NEGATIVE
Comment: NEGATIVE
Comment: NEGATIVE
Comment: NORMAL
Neisseria Gonorrhea: NEGATIVE
Trichomonas: NEGATIVE

## 2023-02-02 ENCOUNTER — Telehealth: Payer: Self-pay

## 2023-02-02 MED ORDER — METRONIDAZOLE 500 MG PO TABS: 500.0000 mg | ORAL_TABLET | Freq: Two times a day (BID) | ORAL | 0 refills | Status: AC

## 2023-02-02 NOTE — Telephone Encounter (Signed)
Per protocol, pt requires tx with metronidazole. Rx sent to pharmacy on file.

## 2023-03-01 ENCOUNTER — Other Ambulatory Visit: Payer: Self-pay | Admitting: Internal Medicine

## 2023-07-26 ENCOUNTER — Other Ambulatory Visit (HOSPITAL_COMMUNITY): Payer: Self-pay

## 2023-07-28 ENCOUNTER — Other Ambulatory Visit (HOSPITAL_COMMUNITY): Payer: Self-pay

## 2023-07-29 ENCOUNTER — Telehealth: Payer: Self-pay | Admitting: Internal Medicine

## 2023-07-29 NOTE — Telephone Encounter (Signed)
 Copied from CRM [id number]. Topic: General - Other >> Jul 29, 2023  9:56 AM Mia F wrote: Reason for CRM: Pt has a form that need to be completed. She has a physical scheduled for 12/31. She says the paperwork required blood work to be completed but it is to show she has had a physical done. Pt would like to be contacted via phone. Please advise

## 2023-07-29 NOTE — Telephone Encounter (Signed)
 Please advise if we can use physical from 12/2022

## 2023-08-10 ENCOUNTER — Other Ambulatory Visit (HOSPITAL_COMMUNITY): Payer: Self-pay

## 2023-08-10 ENCOUNTER — Encounter (HOSPITAL_COMMUNITY): Payer: Self-pay

## 2023-09-16 ENCOUNTER — Telehealth: Payer: Self-pay | Admitting: Internal Medicine

## 2023-09-16 NOTE — Telephone Encounter (Signed)
Received and being reviewed

## 2023-09-16 NOTE — Telephone Encounter (Signed)
 Can we use physical from 12/24?

## 2023-09-16 NOTE — Telephone Encounter (Signed)
 Form dropped off and left for completion by Cr. Crawford.   Form placed in  Dr.'s box up front. Please call patient at [phone] when form is completed.

## 2023-09-17 ENCOUNTER — Other Ambulatory Visit (HOSPITAL_COMMUNITY): Payer: Self-pay

## 2023-09-21 NOTE — Telephone Encounter (Signed)
 Do you know where this form is? I sent it back so information could be inputted on it for signature and it never came back.

## 2023-09-21 NOTE — Telephone Encounter (Signed)
 Unfortunately I have misplaced this form and I have informed the patient about this and have asked her to either fax it back or upload to my chart or drop off another copy

## 2023-09-22 NOTE — Telephone Encounter (Signed)
 Did a follow up call and LVM for patient as it went straight to voicemail.

## 2023-09-23 NOTE — Telephone Encounter (Signed)
 Finally got in contact with patient and she will email me her for to be signed and fill out and I will place it up front for her tomorrow

## 2023-09-23 NOTE — Telephone Encounter (Signed)
 This has been filled out and signed and placed up front for patient

## 2023-10-07 ENCOUNTER — Other Ambulatory Visit: Payer: Self-pay | Admitting: Internal Medicine

## 2024-01-05 ENCOUNTER — Encounter: Payer: Self-pay | Admitting: Internal Medicine

## 2024-01-05 ENCOUNTER — Ambulatory Visit: Admitting: Internal Medicine

## 2024-01-05 VITALS — BP 90/60 | HR 57 | Temp 98.4°F | Ht 61.0 in | Wt 143.0 lb

## 2024-01-05 DIAGNOSIS — E559 Vitamin D deficiency, unspecified: Secondary | ICD-10-CM

## 2024-01-05 DIAGNOSIS — L308 Other specified dermatitis: Secondary | ICD-10-CM | POA: Diagnosis not present

## 2024-01-05 DIAGNOSIS — Z0000 Encounter for general adult medical examination without abnormal findings: Secondary | ICD-10-CM

## 2024-01-05 DIAGNOSIS — R5383 Other fatigue: Secondary | ICD-10-CM

## 2024-01-05 DIAGNOSIS — F17200 Nicotine dependence, unspecified, uncomplicated: Secondary | ICD-10-CM

## 2024-01-05 DIAGNOSIS — J069 Acute upper respiratory infection, unspecified: Secondary | ICD-10-CM | POA: Diagnosis not present

## 2024-01-05 DIAGNOSIS — Z0001 Encounter for general adult medical examination with abnormal findings: Secondary | ICD-10-CM

## 2024-01-05 MED ORDER — BETAMETHASONE VALERATE 0.1 % EX OINT: TOPICAL_OINTMENT | Freq: Every day | CUTANEOUS | 11 refills | Status: DC

## 2024-01-05 MED ORDER — FLUTICASONE PROPIONATE 50 MCG/ACT NA SUSP: 2.0000 | Freq: Every day | NASAL | 6 refills | Status: DC

## 2024-01-05 NOTE — Assessment & Plan Note (Signed)
 Unable to make quit attempt currently counseled about harm/risk of smoking.

## 2024-01-05 NOTE — Assessment & Plan Note (Signed)
 Checking CBC, CMP, HgA1c, TSH, B12 to assess fatigue.

## 2024-01-05 NOTE — Assessment & Plan Note (Signed)
 Rx flonase  and no flu or covid-19 testing done as outside the window for treatment. Reassurance given. Lungs clear on exam.

## 2024-01-05 NOTE — Assessment & Plan Note (Signed)
 Flu shot yearly up to date. Pneumonia due soon. Tetanus up to date. Pap smear gets with gyn will schedule yearly visit. Counseled about sun safety and mole surveillance. Counseled about the dangers of distracted driving. Given 10 year screening recommendations.

## 2024-01-05 NOTE — Addendum Note (Signed)
 Addended by: ROLLENE NORRIS A on: 01/05/2024 11:56 AM   Modules accepted: Orders

## 2024-01-05 NOTE — Assessment & Plan Note (Signed)
 Checking vitamin D  as more fatigue and this was symptom of this previously.

## 2024-01-05 NOTE — Progress Notes (Signed)
 "  Subjective:   Patient ID: Monica Mclaughlin, female    DOB: 1990-04-11, 33 y.o.   MRN: [id number]  The patient is here for physical. Pertinent topics discussed: Discussed the use of AI scribe software for clinical note transcription with the patient, who gave verbal consent to proceed.  History of Present Illness Monica Mclaughlin is a 33 year old female who presents with flu-like symptoms persisting since Christmas.  She has been experiencing flu-like symptoms since Christmas, initially after caring for her child who had the flu. Her symptoms include feeling 'hot, cold', low energy, sneezing, nasal congestion, and chest congestion. She has a cough with minimal sputum production and persistent chest congestion.  She has been managing her symptoms with over-the-counter medications and has not taken any prescription medications. Her child was treated with Tamiflu.  She consistently feels tired, not just during the current illness, and has a history of low vitamin D  levels, which she supplements. Despite adequate fluid intake, she feels dehydrated.  She is a smoker and experiences occasional wheezing. She has attempted to quit smoking multiple times but finds it challenging due to increased appetite and weight gain concerns.  No fever. She reports occasional wheezing and is a smoker. No new stomach troubles, diarrhea, constipation, or heartburn. Regular bowel movements, typically once or twice a day, which she considers normal.  PMH, FMH, social history reviewed and updated  Review of Systems  Constitutional:  Positive for activity change and appetite change. Negative for chills, fatigue, fever and unexpected weight change.  HENT:  Positive for congestion, postnasal drip, rhinorrhea and sinus pressure. Negative for ear discharge, ear pain, sinus pain, sneezing, sore throat, tinnitus, trouble swallowing and voice change.   Eyes: Negative.   Respiratory:  Negative for cough, chest tightness,  shortness of breath and wheezing.   Cardiovascular: Negative.  Negative for chest pain, palpitations and leg swelling.  Gastrointestinal: Negative.  Negative for abdominal distention, abdominal pain, constipation, diarrhea, nausea and vomiting.  Musculoskeletal: Negative.   Skin: Negative.   Neurological: Negative.   Psychiatric/Behavioral: Negative.      Objective:  Physical Exam Constitutional:      Appearance: She is well-developed.  HENT:     Head: Normocephalic and atraumatic.     Comments: Oropharynx with redness and clear drainage, nose with swollen turbinates, TMs normal bilaterally.  Neck:     Thyroid : No thyromegaly.  Cardiovascular:     Rate and Rhythm: Normal rate and regular rhythm.  Pulmonary:     Effort: Pulmonary effort is normal. No respiratory distress.     Breath sounds: Normal breath sounds. No wheezing or rales.  Abdominal:     General: Bowel sounds are normal. There is no distension.     Palpations: Abdomen is soft.     Tenderness: There is no abdominal tenderness.  Musculoskeletal:        General: No tenderness.     Cervical back: Normal range of motion.  Lymphadenopathy:     Cervical: No cervical adenopathy.  Skin:    General: Skin is warm and dry.  Neurological:     Mental Status: She is alert and oriented to person, place, and time.     Coordination: Coordination normal.     Vitals:   01/05/24 1113  BP: 90/60  Pulse: (!) 57  Temp: 98.4 F (36.9 C)  TempSrc: Oral  SpO2: 99%  Weight: 143 lb (64.9 kg)  Height: 5' 1 (1.549 m)    Assessment & Plan:   "

## 2024-01-05 NOTE — Assessment & Plan Note (Signed)
 Refilled betamethasone  that she uses for eczema.

## 2024-01-06 LAB — COMPREHENSIVE METABOLIC PANEL WITH GFR
AG Ratio: 1.6 (calc) (ref 1.0–2.5)
ALT: 13 U/L (ref 6–29)
AST: 15 U/L (ref 10–30)
Albumin: 4.2 g/dL (ref 3.6–5.1)
Alkaline phosphatase (APISO): 62 U/L (ref 31–125)
BUN: 8 mg/dL (ref 7–25)
CO2: 26 mmol/L (ref 20–32)
Calcium: 9.4 mg/dL (ref 8.6–10.2)
Chloride: 103 mmol/L (ref 98–110)
Creat: 0.81 mg/dL (ref 0.50–0.97)
Globulin: 2.7 g/dL (ref 1.9–3.7)
Glucose, Bld: 88 mg/dL (ref 65–99)
Potassium: 4.2 mmol/L (ref 3.5–5.3)
Sodium: 136 mmol/L (ref 135–146)
Total Bilirubin: 0.5 mg/dL (ref 0.2–1.2)
Total Protein: 6.9 g/dL (ref 6.1–8.1)
eGFR: 97 mL/min/1.73m2

## 2024-01-06 LAB — CBC
HCT: 43.2 % (ref 35.9–46.0)
Hemoglobin: 13.9 g/dL (ref 11.7–15.5)
MCH: 26.9 pg — ABNORMAL LOW (ref 27.0–33.0)
MCHC: 32.2 g/dL (ref 31.6–35.4)
MCV: 83.6 fL (ref 81.4–101.7)
MPV: 12.2 fL (ref 7.5–12.5)
Platelets: 175 Thousand/uL (ref 140–400)
RBC: 5.17 Million/uL — ABNORMAL HIGH (ref 3.80–5.10)
RDW: 13.6 % (ref 11.0–15.0)
WBC: 5.5 Thousand/uL (ref 3.8–10.8)

## 2024-01-06 LAB — LIPID PANEL
Cholesterol: 148 mg/dL
HDL: 51 mg/dL
LDL Cholesterol (Calc): 83 mg/dL
Non-HDL Cholesterol (Calc): 97 mg/dL
Total CHOL/HDL Ratio: 2.9 (calc)
Triglycerides: 67 mg/dL

## 2024-01-06 LAB — VITAMIN B12: Vitamin B-12: 395 pg/mL (ref 200–1100)

## 2024-01-06 LAB — HEMOGLOBIN A1C
Hgb A1c MFr Bld: 5.2 %
Mean Plasma Glucose: 103 mg/dL
eAG (mmol/L): 5.7 mmol/L

## 2024-01-06 LAB — VITAMIN D 25 HYDROXY (VIT D DEFICIENCY, FRACTURES): Vit D, 25-Hydroxy: 22 ng/mL — ABNORMAL LOW (ref 30–100)

## 2024-01-06 LAB — TSH: TSH: 1.52 m[IU]/L

## 2024-01-08 ENCOUNTER — Ambulatory Visit
Admission: EM | Admit: 2024-01-08 | Discharge: 2024-01-08 | Disposition: A | Discharge status: Home / Self Care | Attending: Emergency Medicine | Admitting: Emergency Medicine

## 2024-01-08 ENCOUNTER — Encounter: Payer: Self-pay | Admitting: *Deleted

## 2024-01-08 DIAGNOSIS — N898 Other specified noninflammatory disorders of vagina: Secondary | ICD-10-CM | POA: Diagnosis not present

## 2024-01-08 DIAGNOSIS — Z113 Encounter for screening for infections with a predominantly sexual mode of transmission: Secondary | ICD-10-CM | POA: Diagnosis not present

## 2024-01-08 NOTE — Discharge Instructions (Signed)
 Your results will return over the next few days and someone will call for results are positive or require any additional treatment.  Your results will also be available to you on MyChart.  Return here as needed.

## 2024-01-08 NOTE — ED Provider Notes (Signed)
 [facility]    CSN: [id number] Arrival date & time: 01/08/24  0851      History   Chief Complaint Chief Complaint  Patient presents with   Vaginal Discharge    HPI Monica Mclaughlin is a 34 y.o. female.   Patient presents with malodorous vaginal discharge that began approximately 1 week ago.  Patient denies abnormal vaginal bleeding, vaginal pain, vaginal itching, vaginal lesions, abdominal pain, dysuria, hematuria, and urinary frequency/urgency.  LMP 12/13.  Patient reports that she is sexually active and would like STD testing.  Patient denies any known exposures to STDs.  The history is provided by the patient and medical records.  Vaginal Discharge   Past Medical History:  Diagnosis Date   Abnormal Pap smear 2011   ASCUS (atypical squamous cells of undetermined significance) on Pap smear    ASCUS with positive high risk HPV 06/2006   Candidiasis    Dysplasia of cervix, low grade (CIN 1) 2008   Dysplasia of cervix, low grade (CIN 1)    Eczema    H/O dysmenorrhea    Monilial vulvovaginitis 08/2005   Sickle cell trait    Vulvovaginitis     Patient Active Problem List   Diagnosis Date Noted   Acute URI 01/05/2024   Other fatigue 08/10/2022   Food allergy 12/22/2021   Vitamin D  deficiency 08/30/2020   Encounter for general adult medical examination with abnormal findings 08/30/2020   Toenail fungus 08/13/2020   Eczema 01/10/2018   Hemorrhoids 10/10/2014   Irregular heart beats 09/10/2011   Smoker 06/18/2011    Past Surgical History:  Procedure Laterality Date   WISDOM TOOTH EXTRACTION      OB History     Gravida  1   Para  1   Term  1   Preterm  0   AB  0   Living  1      SAB  0   IAB  0   Ectopic  0   Multiple  0   Live Births  1            Home Medications    Prior to Admission medications  Not on File    Family History Family History  Problem Relation Age of Onset   Hypertension Mother    Asthma Mother     Alcohol abuse Father    Hypertension Maternal Grandmother    Diabetes Maternal Grandfather    Cancer Maternal Grandfather    Hypertension Maternal Aunt     Social History Social History[1]   Allergies   Betadine [povidone iodine], Povidone-iodine, Shrimp extract, and Shellfish allergy   Review of Systems Review of Systems  Genitourinary:  Positive for vaginal discharge.   Per HPI  Physical Exam Triage Vital Signs ED Triage Vitals  Encounter Vitals Group     BP 01/08/24 0905 110/68     Girls Systolic BP Percentile --      Girls Diastolic BP Percentile --      Boys Systolic BP Percentile --      Boys Diastolic BP Percentile --      Pulse Rate 01/08/24 0905 76     Resp 01/08/24 0905 16     Temp 01/08/24 0905 98 F (36.7 C)     Temp Source 01/08/24 0905 Oral     SpO2 01/08/24 0905 98 %     Weight --      Height --      Head Circumference --  Peak Flow --      Pain Score 01/08/24 0907 0     Pain Loc --      Pain Education --      Exclude from Growth Chart --    No data found.  Updated Vital Signs BP 110/68   Pulse 76   Temp 98 F (36.7 C) (Oral)   Resp 16   LMP 12/18/2023 (Exact Date)   SpO2 98%   Visual Acuity Right Eye Distance:   Left Eye Distance:   Bilateral Distance:    Right Eye Near:   Left Eye Near:    Bilateral Near:     Physical Exam Vitals and nursing note reviewed.  Constitutional:      General: She is awake. She is not in acute distress.    Appearance: Normal appearance. She is well-developed and well-groomed. She is not ill-appearing.  Genitourinary:    Comments: Exam deferred Skin:    General: Skin is warm and dry.  Neurological:     Mental Status: She is alert.  Psychiatric:        Behavior: Behavior is cooperative.      UC Treatments / Results  Labs (all labs ordered are listed, but only abnormal results are displayed) Labs Reviewed  HIV ANTIBODY (ROUTINE TESTING W REFLEX)  SYPHILIS: RPR W/REFLEX TO RPR TITER AND  TREPONEMAL ANTIBODIES, TRADITIONAL SCREENING AND DIAGNOSIS ALGORITHM  CERVICOVAGINAL ANCILLARY ONLY    EKG   Radiology No results found.  Procedures Procedures (including critical care time)  Medications Ordered in UC Medications - No data to display  Initial Impression / Assessment and Plan / UC Course  I have reviewed the triage vital signs and the nursing notes.  Pertinent labs & imaging results that were available during my care of the patient were reviewed by me and considered in my medical decision making (see chart for details).     Patient is overall well-appearing.  Vitals are stable.  Exam deferred.  Patient performed self swab for STD/STI.  HIV and RPR ordered.  Discussed follow-up and return precautions. Final Clinical Impressions(s) / UC Diagnoses   Final diagnoses:  Vaginal discharge  Screen for STD (sexually transmitted disease)     Discharge Instructions      Your results will return over the next few days and someone will call for results are positive or require any additional treatment.  Your results will also be available to you on MyChart.  Return here as needed.   ED Prescriptions   None    PDMP not reviewed this encounter.    [1]  Social History Tobacco Use   Smoking status: Every Day    Current packs/day: 1.00    Average packs/day: 1 pack/day for 20.0 years (20.0 ttl pk-yrs)    Types: Cigarettes    Start date: 2006    Passive exposure: Current   Smokeless tobacco: Never  Vaping Use   Vaping status: Never Used  Substance Use Topics   Alcohol use: Yes    Comment: weekends   Drug use: No     Johnie Rumaldo LABOR, NP 01/08/24 [phone]  "

## 2024-01-08 NOTE — ED Triage Notes (Signed)
 C/O vaginal discharge onset approx 1 wk ago. Denies abd pain or fevers. Denies any known exposures.

## 2024-01-09 LAB — HIV ANTIBODY (ROUTINE TESTING W REFLEX): HIV Screen 4th Generation wRfx: NONREACTIVE

## 2024-01-09 LAB — SYPHILIS: RPR W/REFLEX TO RPR TITER AND TREPONEMAL ANTIBODIES, TRADITIONAL SCREENING AND DIAGNOSIS ALGORITHM: RPR Ser Ql: NONREACTIVE

## 2024-01-10 ENCOUNTER — Ambulatory Visit (HOSPITAL_COMMUNITY): Payer: Self-pay

## 2024-01-10 LAB — CERVICOVAGINAL ANCILLARY ONLY
Bacterial Vaginitis (gardnerella): POSITIVE — AB
Candida Glabrata: NEGATIVE
Candida Vaginitis: NEGATIVE
Chlamydia: NEGATIVE
Comment: NEGATIVE
Comment: NEGATIVE
Comment: NEGATIVE
Comment: NEGATIVE
Comment: NEGATIVE
Comment: NORMAL
Neisseria Gonorrhea: NEGATIVE
Trichomonas: POSITIVE — AB

## 2024-01-11 ENCOUNTER — Telehealth: Payer: Self-pay

## 2024-01-11 MED ORDER — METRONIDAZOLE 500 MG PO TABS: 500.0000 mg | ORAL_TABLET | Freq: Two times a day (BID) | ORAL | 0 refills | Status: AC

## 2024-01-11 NOTE — Telephone Encounter (Signed)
 This RN returned patient's phone call at this time. Name and DOB verified. Tested positive for Trich on 1/3 here at [facility]. Pt has questions regarding treatment. Informed patient the treatment medication for Trich is Flagyl . This was called into her pharmacy on file yesterday by callback nurse, Alfonso, per protocol. Advised pt to take full dose of antibiotic. Per prescription, two tablets for seven days. Pt verbalized understanding.

## 2024-01-12 ENCOUNTER — Ambulatory Visit: Payer: Self-pay | Admitting: Internal Medicine
